# Patient Record
Sex: Female | Born: 1960 | Race: White | Hispanic: No | Marital: Married | Smoking: Never smoker
Health system: Southern US, Community
[De-identification: ages and names within clinical notes are randomized; demographics above are authoritative.]

## PROBLEM LIST (undated history)

## (undated) DIAGNOSIS — C569 Malignant neoplasm of unspecified ovary: Secondary | ICD-10-CM

## (undated) DIAGNOSIS — C179 Malignant neoplasm of small intestine, unspecified: Secondary | ICD-10-CM

## (undated) HISTORY — PX: ABDOMINAL HYSTERECTOMY: SHX81

## (undated) HISTORY — PX: EXPLORATORY LAPAROTOMY W/ BOWEL RESECTION: SHX1544

---

## 1999-07-13 ENCOUNTER — Ambulatory Visit: Admission: RE | Admit: 1999-07-13 | Discharge: 1999-07-13 | Payer: Self-pay | Admitting: Gynecologic Oncology

## 1999-11-08 ENCOUNTER — Ambulatory Visit (HOSPITAL_COMMUNITY): Admission: RE | Admit: 1999-11-08 | Discharge: 1999-11-08 | Payer: Self-pay | Admitting: Gynecologic Oncology

## 1999-12-16 ENCOUNTER — Ambulatory Visit (HOSPITAL_COMMUNITY): Admission: RE | Admit: 1999-12-16 | Discharge: 1999-12-16 | Payer: Self-pay | Admitting: Gynecologic Oncology

## 1999-12-16 ENCOUNTER — Encounter: Payer: Self-pay | Admitting: Gynecologic Oncology

## 1999-12-27 ENCOUNTER — Encounter: Payer: Self-pay | Admitting: Gynecologic Oncology

## 1999-12-27 ENCOUNTER — Ambulatory Visit (HOSPITAL_COMMUNITY): Admission: RE | Admit: 1999-12-27 | Discharge: 1999-12-27 | Payer: Self-pay | Admitting: Gynecologic Oncology

## 1999-12-27 ENCOUNTER — Encounter (INDEPENDENT_AMBULATORY_CARE_PROVIDER_SITE_OTHER): Payer: Self-pay

## 2000-01-10 ENCOUNTER — Ambulatory Visit: Admission: RE | Admit: 2000-01-10 | Discharge: 2000-01-10 | Payer: Self-pay | Admitting: Gynecology

## 2000-04-11 ENCOUNTER — Ambulatory Visit: Admission: RE | Admit: 2000-04-11 | Discharge: 2000-04-11 | Payer: Self-pay | Admitting: Gynecologic Oncology

## 2000-11-06 ENCOUNTER — Ambulatory Visit: Admission: RE | Admit: 2000-11-06 | Discharge: 2000-11-06 | Payer: Self-pay | Admitting: Gynecologic Oncology

## 2000-11-06 ENCOUNTER — Encounter: Payer: Self-pay | Admitting: Gynecologic Oncology

## 2000-11-06 ENCOUNTER — Ambulatory Visit (HOSPITAL_COMMUNITY): Admission: RE | Admit: 2000-11-06 | Discharge: 2000-11-06 | Payer: Self-pay | Admitting: Gynecologic Oncology

## 2001-07-30 ENCOUNTER — Ambulatory Visit: Admission: RE | Admit: 2001-07-30 | Discharge: 2001-07-30 | Payer: Self-pay | Admitting: Gynecologic Oncology

## 2002-01-04 ENCOUNTER — Encounter: Payer: Self-pay | Admitting: Gynecologic Oncology

## 2002-01-04 ENCOUNTER — Ambulatory Visit (HOSPITAL_COMMUNITY): Admission: RE | Admit: 2002-01-04 | Discharge: 2002-01-04 | Payer: Self-pay | Admitting: Gynecologic Oncology

## 2002-03-20 ENCOUNTER — Encounter: Payer: Self-pay | Admitting: Gynecologic Oncology

## 2002-03-20 ENCOUNTER — Ambulatory Visit (HOSPITAL_COMMUNITY): Admission: RE | Admit: 2002-03-20 | Discharge: 2002-03-20 | Payer: Self-pay | Admitting: Gynecologic Oncology

## 2002-03-20 ENCOUNTER — Encounter (INDEPENDENT_AMBULATORY_CARE_PROVIDER_SITE_OTHER): Payer: Self-pay

## 2002-05-06 ENCOUNTER — Ambulatory Visit: Admission: RE | Admit: 2002-05-06 | Discharge: 2002-05-06 | Payer: Self-pay | Admitting: Gynecologic Oncology

## 2002-11-19 ENCOUNTER — Ambulatory Visit: Admission: RE | Admit: 2002-11-19 | Discharge: 2002-11-19 | Payer: Self-pay | Admitting: Gynecologic Oncology

## 2003-03-06 ENCOUNTER — Ambulatory Visit (HOSPITAL_COMMUNITY): Admission: RE | Admit: 2003-03-06 | Discharge: 2003-03-06 | Payer: Self-pay | Admitting: Gynecologic Oncology

## 2003-03-06 ENCOUNTER — Encounter: Payer: Self-pay | Admitting: Gynecologic Oncology

## 2003-05-05 ENCOUNTER — Ambulatory Visit: Admission: RE | Admit: 2003-05-05 | Discharge: 2003-05-05 | Payer: Self-pay | Admitting: Gynecologic Oncology

## 2003-05-11 ENCOUNTER — Ambulatory Visit (HOSPITAL_COMMUNITY): Admission: RE | Admit: 2003-05-11 | Discharge: 2003-05-11 | Payer: Self-pay | Admitting: Gynecologic Oncology

## 2003-05-11 ENCOUNTER — Encounter: Payer: Self-pay | Admitting: Gynecologic Oncology

## 2003-05-14 ENCOUNTER — Ambulatory Visit (HOSPITAL_COMMUNITY): Admission: RE | Admit: 2003-05-14 | Discharge: 2003-05-14 | Payer: Self-pay | Admitting: Gynecologic Oncology

## 2003-05-14 ENCOUNTER — Encounter: Payer: Self-pay | Admitting: Gynecologic Oncology

## 2003-05-14 ENCOUNTER — Encounter (INDEPENDENT_AMBULATORY_CARE_PROVIDER_SITE_OTHER): Payer: Self-pay

## 2003-05-18 ENCOUNTER — Encounter: Payer: Self-pay | Admitting: Gynecologic Oncology

## 2003-05-18 ENCOUNTER — Ambulatory Visit (HOSPITAL_COMMUNITY): Admission: RE | Admit: 2003-05-18 | Discharge: 2003-05-18 | Payer: Self-pay | Admitting: Gynecologic Oncology

## 2003-12-30 ENCOUNTER — Ambulatory Visit: Admission: RE | Admit: 2003-12-30 | Discharge: 2003-12-30 | Payer: Self-pay | Admitting: Gynecologic Oncology

## 2004-02-04 ENCOUNTER — Ambulatory Visit (HOSPITAL_COMMUNITY): Admission: RE | Admit: 2004-02-04 | Discharge: 2004-02-04 | Payer: Self-pay | Admitting: Gynecologic Oncology

## 2004-06-07 ENCOUNTER — Ambulatory Visit: Admission: RE | Admit: 2004-06-07 | Discharge: 2004-06-07 | Payer: Self-pay | Admitting: Gynecologic Oncology

## 2005-03-20 ENCOUNTER — Ambulatory Visit (HOSPITAL_COMMUNITY): Admission: RE | Admit: 2005-03-20 | Discharge: 2005-03-20 | Payer: Self-pay | Admitting: Gynecologic Oncology

## 2005-03-20 ENCOUNTER — Encounter (INDEPENDENT_AMBULATORY_CARE_PROVIDER_SITE_OTHER): Payer: Self-pay | Admitting: *Deleted

## 2006-03-21 ENCOUNTER — Ambulatory Visit: Admission: RE | Admit: 2006-03-21 | Discharge: 2006-03-21 | Payer: Self-pay | Admitting: Obstetrics and Gynecology

## 2006-07-24 ENCOUNTER — Ambulatory Visit: Admission: RE | Admit: 2006-07-24 | Discharge: 2006-07-24 | Payer: Self-pay | Admitting: Gynecologic Oncology

## 2006-08-13 ENCOUNTER — Encounter (INDEPENDENT_AMBULATORY_CARE_PROVIDER_SITE_OTHER): Payer: Self-pay | Admitting: *Deleted

## 2006-08-13 ENCOUNTER — Ambulatory Visit (HOSPITAL_COMMUNITY): Admission: RE | Admit: 2006-08-13 | Discharge: 2006-08-13 | Payer: Self-pay | Admitting: Obstetrics and Gynecology

## 2007-01-30 ENCOUNTER — Ambulatory Visit: Admission: RE | Admit: 2007-01-30 | Discharge: 2007-01-30 | Payer: Self-pay | Admitting: Gynecologic Oncology

## 2007-07-23 ENCOUNTER — Ambulatory Visit: Admission: RE | Admit: 2007-07-23 | Discharge: 2007-07-23 | Payer: Self-pay | Admitting: Gynecologic Oncology

## 2007-12-20 ENCOUNTER — Ambulatory Visit: Admission: RE | Admit: 2007-12-20 | Discharge: 2007-12-20 | Payer: Self-pay | Admitting: Gynecologic Oncology

## 2007-12-20 ENCOUNTER — Encounter (INDEPENDENT_AMBULATORY_CARE_PROVIDER_SITE_OTHER): Payer: Self-pay | Admitting: Interventional Radiology

## 2008-03-10 ENCOUNTER — Ambulatory Visit: Admission: RE | Admit: 2008-03-10 | Discharge: 2008-03-10 | Payer: Self-pay | Admitting: Gynecologic Oncology

## 2008-09-02 ENCOUNTER — Ambulatory Visit: Admission: RE | Admit: 2008-09-02 | Discharge: 2008-09-02 | Payer: Self-pay | Admitting: Gynecologic Oncology

## 2009-03-10 ENCOUNTER — Ambulatory Visit (HOSPITAL_COMMUNITY): Admission: RE | Admit: 2009-03-10 | Discharge: 2009-03-10 | Payer: Self-pay | Admitting: Gynecologic Oncology

## 2009-03-17 ENCOUNTER — Ambulatory Visit (HOSPITAL_COMMUNITY): Admission: RE | Admit: 2009-03-17 | Discharge: 2009-03-17 | Payer: Self-pay | Admitting: Gynecologic Oncology

## 2009-03-19 ENCOUNTER — Ambulatory Visit (HOSPITAL_COMMUNITY): Admission: RE | Admit: 2009-03-19 | Discharge: 2009-03-19 | Payer: Self-pay | Admitting: Gynecologic Oncology

## 2009-03-19 ENCOUNTER — Encounter (INDEPENDENT_AMBULATORY_CARE_PROVIDER_SITE_OTHER): Payer: Self-pay | Admitting: Interventional Radiology

## 2009-06-09 ENCOUNTER — Ambulatory Visit: Admission: RE | Admit: 2009-06-09 | Discharge: 2009-06-09 | Payer: Self-pay | Admitting: Gynecologic Oncology

## 2009-09-02 ENCOUNTER — Ambulatory Visit (HOSPITAL_COMMUNITY): Admission: RE | Admit: 2009-09-02 | Discharge: 2009-09-02 | Payer: Self-pay | Admitting: Gynecologic Oncology

## 2009-09-15 ENCOUNTER — Ambulatory Visit: Admission: RE | Admit: 2009-09-15 | Discharge: 2009-09-15 | Payer: Self-pay | Admitting: Gynecologic Oncology

## 2010-03-07 ENCOUNTER — Ambulatory Visit (HOSPITAL_COMMUNITY): Admission: RE | Admit: 2010-03-07 | Discharge: 2010-03-07 | Payer: Self-pay | Admitting: Gynecologic Oncology

## 2010-11-23 IMAGING — CT CT ABDOMEN W/ CM
2 of 6 series · 15 of 46 positions shown, 17 images · IV contrast (agent unspecified)
Comparison: Prior abdominal and pelvic CT studies dated 03/20/2005
and prior chest CT dated 03/06/2003

CT CHEST

CLINICAL DATA: History of metastatic ovarian carcinoma.  Palpable
enlarging right inguinal lymph node.  History of recurrent splenic
cyst requiring percutaneous drainage and ablation.

CT CHEST, ABDOMEN AND PELVIS WITH CONTRAST
TECHNIQUE: Multidetector CT imaging of the chest, abdomen and
pelvis was performed following the standard protocol during bolus
administration of intravenous contrast.
Contrast: 100 ml [id number] IV

[Series 2: cap with st · axial · 0.68mm/px · z∈[-597,-42]mm · 12 of 127 slices shown, 14 images]
[im 8/127  soft-tissue]
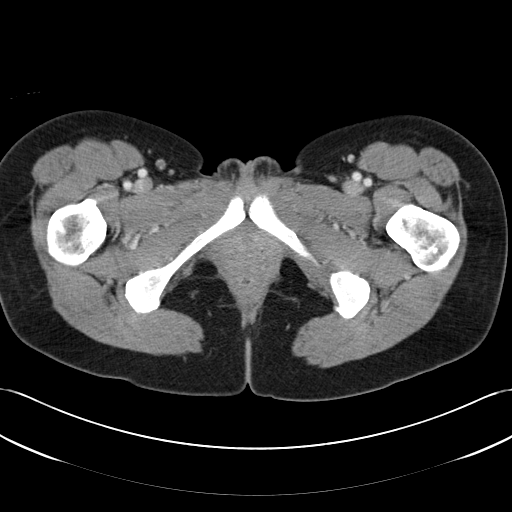
[im 8/127  bone]
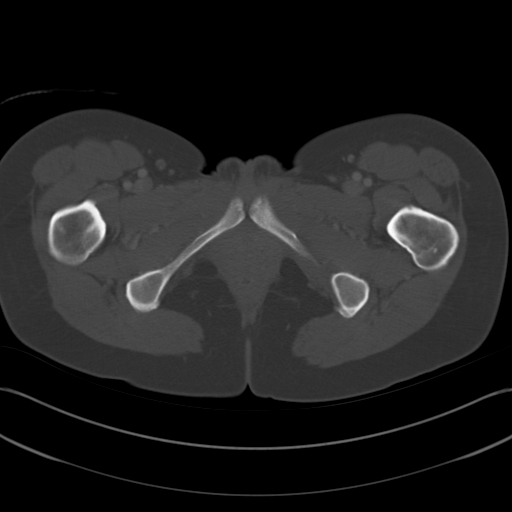
[im 16/127  soft-tissue]
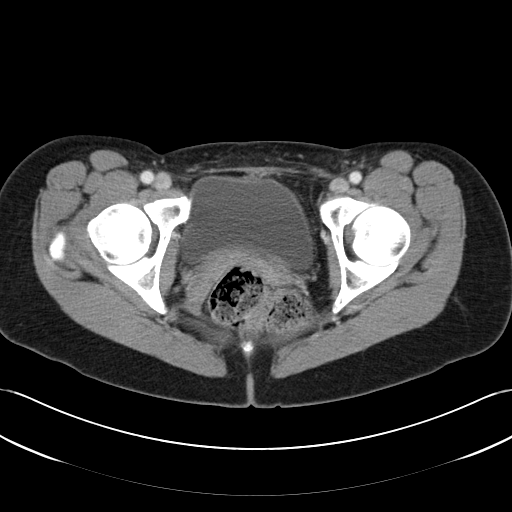
[im 32/127  soft-tissue]
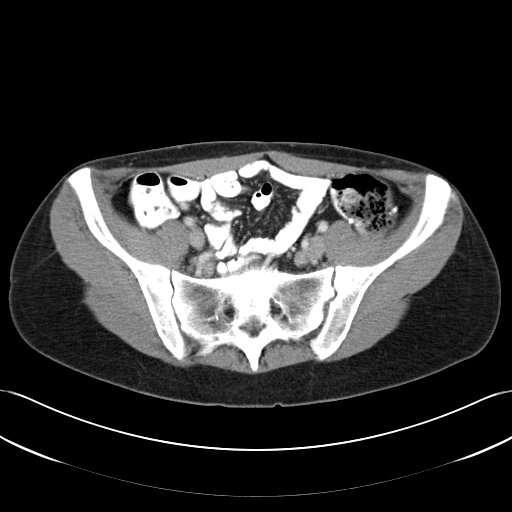
[im 40/127  soft-tissue]
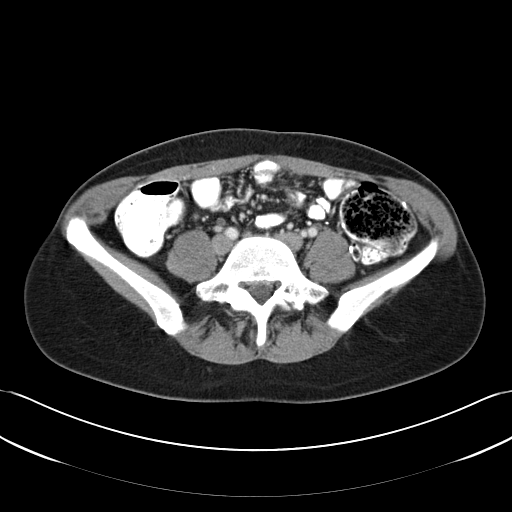
[im 48/127  soft-tissue]
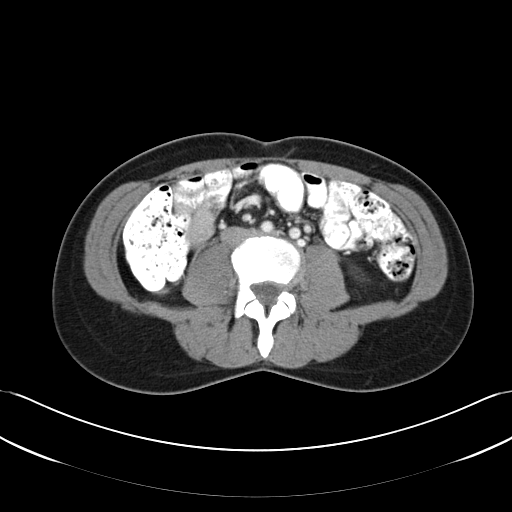
[im 56/127  soft-tissue]
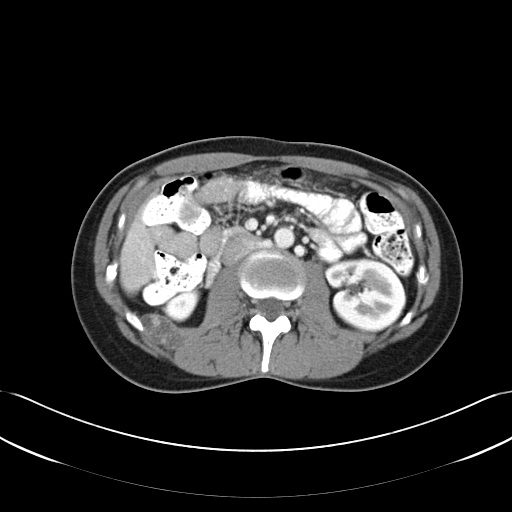
[im 71/127  soft-tissue]
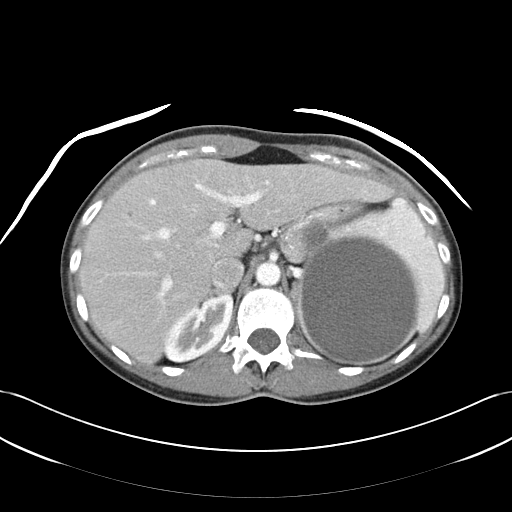
[im 79/127  soft-tissue]
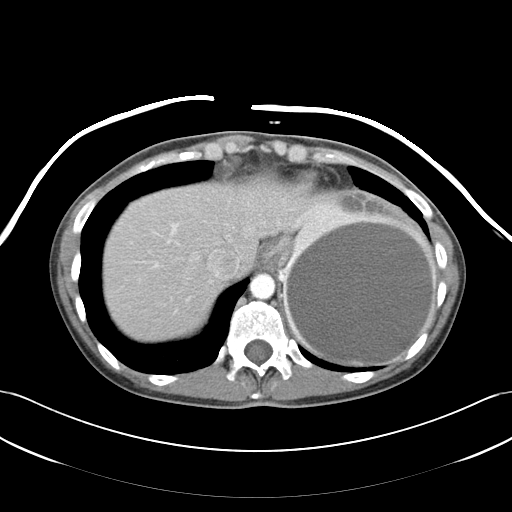
[im 87/127  soft-tissue]
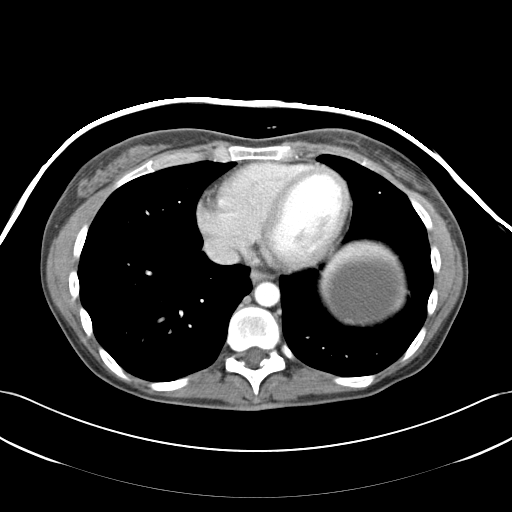
[im 87/127  bone]
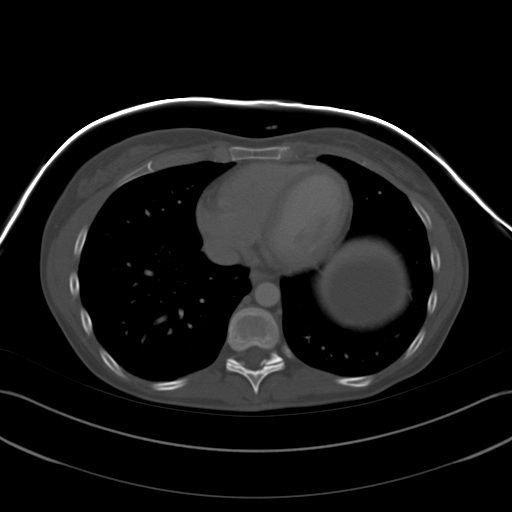
[im 95/127  soft-tissue]
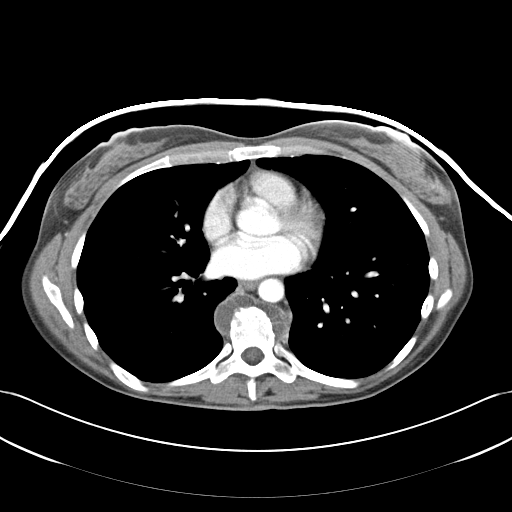
[im 111/127  soft-tissue]
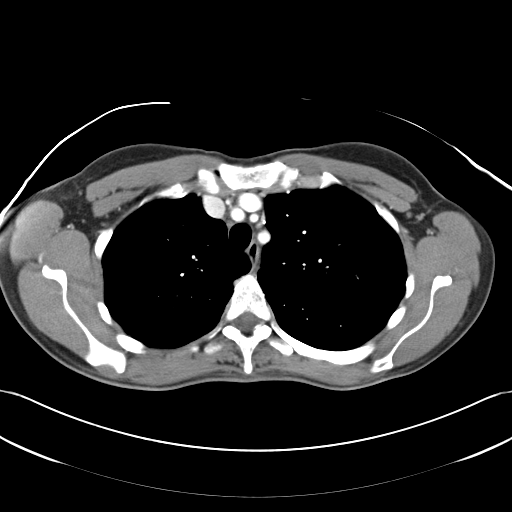
[im 119/127  soft-tissue]
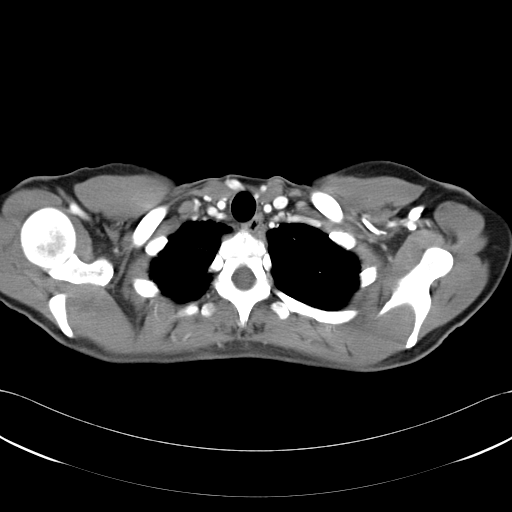

[Series 602: coronal images · coronal · 1.27mm/px · 3 of 74 slices shown]
[im 25/74  soft-tissue]
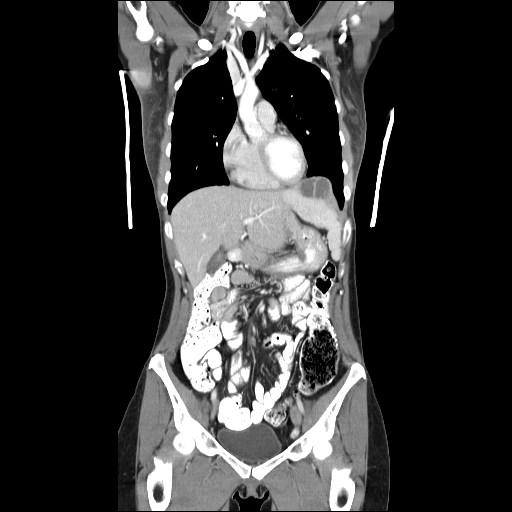
[im 33/74  soft-tissue]
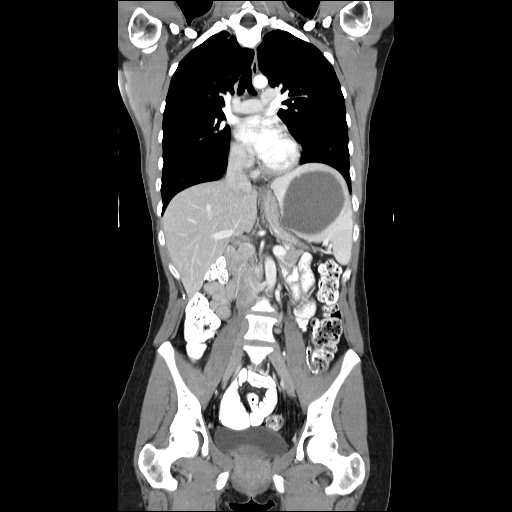
[im 41/74  soft-tissue]
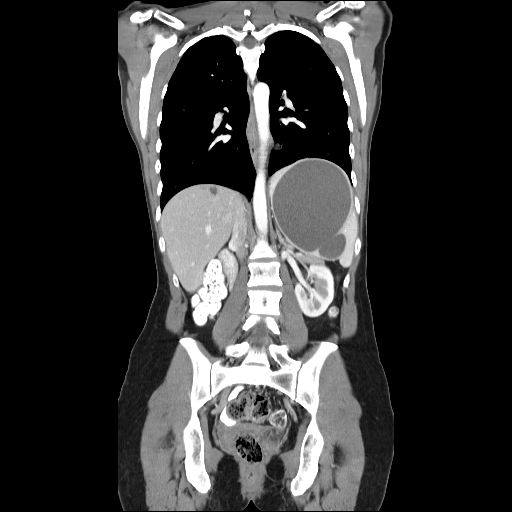

[15 of 46 positions shown; findings below may reference images not displayed]

FINDINGS: There is new paraspinous tumor in the chest not
visualized by CT in 8336.  This consists of necrotic appearing
paraspinous tumor on both sides of the thoracic spine spanning from
approximately the T6 level to the T9 level on the right and the T6
level to T10 level on the left.  Circumferential tumor surrounds
much of the circumference of the vertebral body anteriorly and
laterally at these levels with measured maximal thickness of
approximately 1.4 cm on the right and also 1.4 cm on the left.

New metastatic lesion is also present in the lower anterior
mediastinum within mediastinal fat and involving the lower margin
of the pericardium.  At this level this tumor measures
approximately 1.6 x 2.2 centimeters on axial images.

No axillary, mediastinal or hilar adenopathy is identified.  Lung
windows show no focal pulmonary masses.  No evidence of pleural or
pericardial fluid.  No bony lesions identified.
IMPRESSION: New metastatic tumor with extensive paraspinous tumor noted in the
chest spanning from roughly T6 to T10 with circumferential necrotic
appearing tumor present.  There also is a lower mediastinal /
pericardial mass measuring 1.6 x 2.2 cm.  This is not causing a
pericardial effusion.

CT ABDOMEN
FINDINGS: The cystic change at the dome of the right lobe of the
liver shows increased size and prominence since 3338 making it
suspicious for necrotic tumor.  Maximal axial dimensions are
approximately 2.1 x 2.1 cm.  There also is a new metastatic lesion
in the posterior lower right flank region involving the abdominal
wall and abutting the posterior lower pole of the right kidney.
This tumor is lobulated and necrotic in appearance measuring
approximately 1.9 x 3.3 x 3.6 cm.  This tumor also abuts a portion
of the distal right 11th rib.

Large splenic cyst present measuring approximately 12 cm in
greatest diameter.  There is a lobulated component to this cyst
inferiorly and anteriorly.  Internal density is consistent with
complex fluid with average density measurement of 25 HU.  There
remains associated stable cystic area in the anterior and superior
spleen that is separate from the dominant splenic cyst.  This
additional area measures approximately 1.6 x 4.8 cm.

No additional lesions are identified in the abdomen.  There is no
evidence of peritoneal or omental disease.  No ascites is present.
No enlarged lymph nodes.  Bowel loops are of normal caliber.
IMPRESSION: New necrotic metastatic disease involving the posterior right flank
region that involves the abdominal wall musculature and abuts the
posterior lower pole of the kidney.  Enlarged cystic area in the
dome of the liver also may represent enlarging necrotic tumor.
There is a recurrent splenic cyst.

CT PELVIS
FINDINGS: Enlarged and necrotic lymphadenopathy is present in the
pelvis.  Multi-loculated and necrotic-appearing lymph node mass in
the right inguinal region extends into the distal external iliac
region and measures approximately 2.2 x 3.8 cm.

Distal external iliac chain mass on the left measures approximately
1.6 x 2.3 cm.  Additional higher external iliac chain lymph node
measures approximately 1.2 x 2.1 cm.  Some other smaller lymph
nodes are also present in bilateral inguinal regions.

No free fluid.  The bladder is unremarkable.  Bowel loops are
normal caliber.  No hernias.  No bony lesions.
IMPRESSION: New necrotic metastatic nodal disease in both external iliac
regions and the right inguinal region.

## 2011-01-02 LAB — APTT: aPTT: 26 seconds (ref 24–37)

## 2011-01-02 LAB — CBC
MCHC: 33.7 g/dL (ref 30.0–36.0)
Platelets: 184 10*3/uL (ref 150–400)
RBC: 5.2 MIL/uL — ABNORMAL HIGH (ref 3.87–5.11)
RDW: 12.5 % (ref 11.5–15.5)

## 2011-01-02 LAB — PROTIME-INR
INR: 0.95 (ref 0.00–1.49)
Prothrombin Time: 12.6 seconds (ref 11.6–15.2)

## 2011-01-23 LAB — PROTIME-INR: INR: 1 (ref 0.00–1.49)

## 2011-01-23 LAB — CBC: WBC: 7.7 10*3/uL (ref 4.0–10.5)

## 2011-01-23 LAB — CA 125: CA 125: 50.1 U/mL — ABNORMAL HIGH (ref 0.0–30.2)

## 2011-02-28 NOTE — Consult Note (Signed)
[name], [name]               ACCOUNT NO.:  [id number]   MEDICAL RECORD NO.:  [id number]          PATIENT TYPE:  OUT   LOCATION:  XRAY                         FACILITY:  [facility]   PHYSICIAN:  John T. Kyla Balzarine, M.D.    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   HISTORY OF PRESENT ILLNESS:  This patient had stage IIIC ovarian  malignancy optimally debulked in 1992 with borderline histology.  She  had TAH/BSO and omentectomy and received postoperative cisplatin and  Cytoxan.  She developed an invasive intraperitoneal recurrence that was  treated with radical surgery in 1996 including rectosigmoid resection  and anastomosis.  She was treated with Taxol and cisplatin, but after an  allergic reaction, Taxol was changed to Taxotere.  She developed  progression in the liver but then had a complete response to topotecan.  Since 2000, she has had persistent/recurrent perisplenic cysts managed  with p.r.n. drainage every 12-18 months.  On several occasions, she has  had positive cytology from the cysts.  In the past, she was treated with  intracystic cisplatin, percutaneous tube placement with sclerosis using  both doxycycline and alcohol injection, and all were unsuccessful.  Last  cyst drainage was in March 2009 with negative cytology but psammoma  bodies identified.  Patient remains quite physically active and has no  significant symptoms other than an occasional grabbing subcostal  discomfort when she exercises hard and a sensation of increasing  fullness in her right groin with a newly palpable lymph node.  She  underwent a follow-up CT scan on Mar 10, 2009, which revealed  paraspinous tumors spanning T6 to T12 with lesions measuring  approximately 1.4 cm maximal thickness, lower anterior mediastinum  measuring 1.6 x 2.2 cm, increased size and prominence of cystic change  at the dome of the right lobe of the liver measuring 2.1 x 2.1 cm, new  lesion in the posterior right flank region abutting the posterior lower  pole of the right kidney, lobulated and necrotic measuring 1.9 x 3.3 x  3.6 cm, splenic cyst measuring 12 cm in greatest diameter with lobulated  component inferiorly and anteriorly.  In the pelvis, a 1.6 x 2.3 cm  distal left external iliac node and higher external iliac chained node  measuring 1.2 x 2.1 cm.  In the right, there is a multiloculated node in  the distal external iliac/groin region measuring 2.2 x 3.8 cm.  PET CT  scan was performed earlier today and was reviewed with radiology,  comparing films to the images from CT scan.  There is at best modest  uptake which correlates with the region seen on CT scan.  The area of  most intense uptake for the most part is in the right groin.   It should be noted the last CA-125 value 6 months ago was in the mid-  60s.   PAST MEDICAL HISTORY:  Dominated by ovarian malignancy.   MEDICATIONS:  1. Premarin 0.9 mg.  2. Multivitamins.   ALLERGIES:  PAXIL and TAXOL.   FAMILY HISTORY:  Mother with pancreatic cancer.   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  Rare social ethanol.   REVIEW OF SYSTEMS:  A 10-point system review negative.   PHYSICAL EXAMINATION:  VITAL SIGNS:  Weight 117 pounds/stable vital  signs.  GENERAL:  Patient is anxious, alert and oriented x3, in no acute  distress.  LYMPH SURVEY:  Significant for a new 3 cm right groin node.  LUNGS:  Clear.  BACK:  No spinous or CVA tenderness.  ABDOMEN:  Soft and benign with no palpable mass, hernia or organomegaly.  No ascites.  EXTREMITIES:  Full strength and range of motion without edema, cords or  Homan's.  PELVIC:  External genitalia and BUS, bladder and urethra and vagina are  all clear.  On bimanual and rectovaginal  examinations, absent uterus and  cervix with no mass or nodularity and no tenderness.   ASSESSMENT:  Progression of LMP tumor; PET activity implies low-grade  lesions.   PLAN:  Patient was discussed with Dr. Fredia Sorrow, and we have scheduled  fine needle aspiration of her large splenic cyst in the right groin node  on March 19, 2009.  Cyst fluid will be sent for cytology, and if positive,  we will obtain staining for estrogen and progesterone receptor.  For  now, we will change the patient from straight Premarin to Prempro.  She  might benefit from hormonal manipulation if cytology confirms low-grade  lesion.      John T. Kyla Balzarine, M.D.  Electronically Signed     JTS/MEDQ  D:  03/17/2009  T:  03/17/2009  Job:  [id number]   cc:   Telford Nab, R.N.  [address]  [place], [place] [address]   Leona Singleton, M.D.  Fax: [phone]

## 2011-02-28 NOTE — Consult Note (Signed)
[name], [name]               ACCOUNT NO.:  [id number]   MEDICAL RECORD NO.:  [id number]          PATIENT TYPE:  OUT   LOCATION:  GYN                          FACILITY:  [facility]   PHYSICIAN:  John T. Kyla Balzarine, M.D.    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   HISTORY OF PRESENT ILLNESS:  This patient had stage III C ovarian  malignancy, optimally debulked in 1992 with borderline histology.  She  had TAH/BSO and omentectomy, receiving cisplatin and Cytoxan.  She  developed an invasive intraperitoneal recurrence treated with radical  surgery in 1996 including rectosigmoid resection/and anastomosis.  She  received Taxol and cisplatin but after an allergic reaction, Taxol was  changed to Taxotere.  She progressed in the liver but had a complete  response to topotecan.  Since 2000, she has had persistent/recurrent  perisplenic cyst managed with p.r.n. drainage every 12 to 18 months.  On  several occasions she has had positive cytology from the cyst.  In the  past, she has been treated with intracystic cisplatin, percutaneous tube  placement with sclerosis using doxycycline and alcohol injection, all  relatively unsuccessful.  Last cyst drainage was in March 2009 and had  only psammoma bodies.  The patient remains quite physically active and  is pursuing an advanced nursing degree.  She has occasional grabbing  subcostal discomfort when she exercises hard and is concerned because  she believes that her exercise tolerance is decreased and she winds  easily.  She also knows pulling discomfort in the right groin.  Bowel  and bladder functions are normal and she denies early satiety or  increased GERD-type symptoms.   PAST MEDICAL HISTORY:  Dominated by ovarian malignancy.   MEDICATIONS:  Premarin and multivitamins.   ALLERGIES:  PAXIL and TAXOL.   FAMILY HISTORY:  Mother with pancreatic cancer.   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  Rare social ethanol.   REVIEW OF SYSTEMS:  A 10-point review negative.   EXAM:  VITAL SIGNS:  The weight is 118 pounds and vital signs stable.  CONSTITUTIONAL:  The patient is anxious, alert and oriented x3 in no  acute distress.  LYMPHATICS:  A 1 cm right groin node, nontender.  BACK:  No spinous or CVA tenderness.  ABDOMEN:  Soft and benign with no palpable mass, hernia, ascites or  tenderness.  EXTREMITIES:  Full strength and range of motion with no cords, Homans'  or edema.  PELVIC:  External genitalia and BUS, bladder and urethra normal to  inspection and palpation.  The vagina has no mucosal lesions.  On  bimanual and rectovaginal examinations, there is some scarring at the  apex of the vagina but no distinct mass or cul-de-sac nodularity.   LAB:  Dr. Laureen Ochs obtained recent CA-125 value that was approximately  66.   ASSESSMENT:  Low malignant potential tumor of the ovary  with recurrent  perisplenic cyst.  Slight rise in CA-125 value.   PLAN:  The patient is quite anxious about her nonspecific symptoms and  we will set up a CT of chest, abdomen and pelvis in the near future.  If  she has only a small perisplenic cyst, we will defer drainage until she  becomes symptomatic.  Routine followup would be in 6 months the and we  would obtain a CA-125 at the time of her next followup.      John T. Kyla Balzarine, M.D.  Electronically Signed     JTS/MEDQ  D:  09/02/2008  T:  09/02/2008  Job:  [id number]   cc:   Leona Singleton, M.D.  Fax: [phone]   Telford Nab, R.N.  [address]  [place], [place] [address]

## 2011-02-28 NOTE — Consult Note (Signed)
[name], [name]               ACCOUNT NO.:  [id number]   MEDICAL RECORD NO.:  [id number]          PATIENT TYPE:  OUT   LOCATION:  GYN                          FACILITY:  [facility]   PHYSICIAN:  John T. Kyla Balzarine, M.D.    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   HISTORY OF PRESENT ILLNESS:  This patient had stage IIIC ovarian  malignancy optimally debulked in 1992.  This had borderline histology.  After TAH/BSO and omentectomy, she received cisplatin and Cytoxan  chemotherapy.  She had an invasive intraperitoneal recurrence treated  with radical surgery in 1996.  She received Taxol and cisplatin  chemotherapy, but after an allergic reaction, the Taxol was changed to  Taxotere.  She had disease progression in the liver, but a complete  response to topotecan.  Since 2000, she has had persistent/recurrent  perisplenic cyst managed with p.r.n. drainage.  On several occasions,  she had positive cytology from the cyst.  She has been treated with  intracystic cisplatin, had percutaneous tube placement with sclerosis  using doxycycline and alcohol injection after cyst aspiration, all  relatively unsuccessful.  She notes slight increase in left upper  quadrant pressure, early satiety and occasional grabbing pain.  She  denies nausea or vomiting or persistent, continuous pain.  She denies  pelvic symptoms.  Her last aspiration was performed approximately 1 year  ago.  She brings with her CT films for review.  Past history, personal  and social history, family history are reviewed and essentially  unchanged from those recorded in June 2007.   MEDICATIONS:  Premarin and multivitamins.   ALLERGIES:  PAXIL and TAXOL.   REVIEW OF SYSTEMS:  Other than noted above, negative in the  comprehensive 10-point review.   EXAM:  VITAL SIGNS:  Stable and  afebrile.  LYMPH SURVEY:  No pathologic lymphadenopathy.  BACK:  No back or CVA tenderness.  EXTREMITIES:  No edema, cords or Homan's.  PELVIC:  External genitalia and BUS are normal to inspection and  palpation.  The bladder and urethra are normal.  Bimanual and  rectovaginal examinations reveal absent uterus and cervix, no mass or  nodularity.   The report from her outside CT scan is reviewed and there is a question  as to whether she actually has any change in multiple low-density  changes adjacent to the capsule of an approximately 10-cm  perisplenic/intrasplenic cyst.  The CD-ROM was given to our vascular  radiologist for comparison to prior scans here and their opinion was  that there was no significant change.   ASSESSMENT:  Recurrent perisplenic cyst; likely persistent low-malignant-  potential tumor of the ovary with the stable findings on CT scan.   PLAN:  CA125 value will be obtained today.  Aspiration of these  perisplenic cyst would be set up on a p.r.n. basis, really dependent  upon the patient's symptoms.  I would like to see her at 57-month  intervals or  sooner at any time on a p.r.n. basis.      John T. Kyla Balzarine, M.D.  Electronically Signed     JTS/MEDQ  D:  07/23/2007  T:  07/24/2007  Job:  [id number]   cc:   Jayme Cloud MD   Telford Nab, R.N.  [address]  [place], [place] [address]

## 2011-02-28 NOTE — Consult Note (Signed)
[name], [name]               ACCOUNT NO.:  [id number]   MEDICAL RECORD NO.:  [id number]          PATIENT TYPE:  OUT   LOCATION:  GYN                          [facility]ACILITY:  [facility]   PHYSICIAN:  John T. Kyla Balzarine, M.D.    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   HISTORY OF PRESENT ILLNESS:  This patient had stage IIIc ovarian  malignancy, optimally debulked in 1992 with borderline histology.  She  had TAH/BSO and omentectomy, receiving cisplatin and Cytoxan.  She had  an invasive intraperitoneal recurrence, treated with radical surgery in  1996.  She received Taxol and cisplatin chemotherapy, but, after an  allergic reaction, Taxol was changed to Taxotere.  She progressed in the  liver but had a complete response to Topotecan.  Since 2000, she has had  a persistent/recurrent perisplenic cyst managed with p.r.n. drainage and  on several occasions has had positive cytology from the cyst.  In the  past she has been treated with intracystic cisplatin, percutaneous tube  placement with sclerosis using doxycycline and alcohol injection after  cyst aspiration, all relatively unsuccessful.  She has intermittent left  upper quadrant pressure but underwent a cyst drainage on March 6,  and those symptoms have improved.  She is quite active and runs 5K runs.  She is working full time.  She denies nausea or vomiting or persistent  continuous pain or pelvic symptoms.  She has an occasional grabbing  subcostal discomfort when she exercises hard.   PAST MEDICAL HISTORY:  Dominated by her ovarian malignancy.   MEDICATIONS:  Premarin and multivitamins.   ALLERGIES:  PAXIL AND TAXOL.   FAMILY HISTORY:  Mother with pancreatic cancer.   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  Rare social  ethanol.   PHYSICAL EXAMINATION:  VITAL SIGNS:  Weight 119 pounds and vital signs  stable.  GENERAL:  The patient is alert and oriented x 3, in no acute distress.  LYMPH SURVEY:  Negative for pathologic lymphadenopathy.  ABDOMEN:  Scaphoid, soft and benign with no hernia, ascites, tenderness  or mass.  EXTREMITIES:  Full strength and range of motion without edema, cords or  Homan's.  PELVIC:  External genitalia and BUS, bladder and urethra and vagina are  all clear.  Bimanual and rectovaginal examinations disclose absent  uterus and cervix with no mass or nodularity and no tenderness.   LABORATORY:  Cytology from the cyst aspiration was significant for  cystic debris and  __________bodies with a few epithelial cells, not  diagnostic.   ASSESSMENT:  Borderline tumor of the ovary involving the peritoneum with  a perisplenic cyst.   PLAN:  The p.r.n. aspirations as we have been doing.  I will plan to see  her back for followup in 6 months and would obtain a CA-125 value at her  next visit.      John T. Kyla Balzarine,  M.D.  Electronically Signed     JTS/MEDQ  D:  03/10/2008  T:  03/10/2008  Job:  [id number]   cc:   Telford Nab, R.N.  [address]  [place], [place] [address]   Leona Singleton, M.D.  Fax: [phone]

## 2011-02-28 NOTE — Consult Note (Signed)
NAMEKAMPBELL, HOLAWAY               ACCOUNT NO.:  [id number]   MEDICAL RECORD NO.:  [id number]          PATIENT TYPE:  OUT   LOCATION:  GYN                          FACILITY:  [facility]   PHYSICIAN:  John T. Kyla Balzarine, M.D.    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   HISTORY OF PRESENT ILLNESS:  This patient had stage III C ovarian  malignancy optimally debulked in 1992 with borderline histology.  She  had a TAH/BSO and omentectomy treated with cisplatin and Cytoxan.  She  developed an intraperitoneal invasive recurrence treated with radical  surgery in 1996 including rectosigmoid resection with reanastomosis.  She progressed after Taxol/cisplatin was changed to Taxotere because of  an allergic reaction.  She had a complete response to topotecan.  Since  2000, she has had recurrent perisplenic cysts managed with p.r.n.  drainage every 12-18 months and has had intermittent positive cytology.  In the past, she was treated with intracystic cisplatin, percutaneous  tube placement with sclerosis using both doxycycline and alcohol; all  unsuccessful.  Follow-up CT scan in May 2010 revealed likely cystic  nodal progression with nodes extending from the groins to the chest.  She had palpable right groin node that measured approximately 3 cm in  largest diameter.  We elected trial of tamoxifen therapy, and she  discontinued her Premarin.  Since she started tamoxifen, she has had hot  flashes but otherwise has tolerated this well.  She has noted no  enlargement in her groin node, pain or leg swelling.   PAST MEDICAL HISTORY:  Dominated by ovarian malignancy.   MEDICATIONS:  Currently tamoxifen and multivitamins.   ALLERGIES:  PAXIL AND TAXOL.   FAMILY HISTORY:  Mother with pancreatic cancer.   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  Rare social ethanol.   REVIEW OF SYSTEMS:  A 10-point review otherwise negative.   PHYSICAL EXAMINATION:  VITAL SIGNS:  Weight stable at 117 pounds.  Lymph  survey is positive only for a 3-cm slightly cystic right groin node,  nontender.  ABDOMEN:  Scaphoid, soft and benign with well-healed midline  incision.  No hernia, ascites, tenderness or mass.  EXTREMITIES:  No cords, edema or Homan's.   ASSESSMENT:  LMP tumor of the ovary with retroperitoneal progression, on  tamoxifen.   PLAN:  CA-125 value will be obtained to date mainly for reference.  She  will undergo follow-up CT scan and CA-125 value in approximately 3  months will see me after these studies.  Unless there is frank  progression, we would continue treatment with tamoxifen indefinitely.      John T. Kyla Balzarine, M.D.  Electronically Signed     JTS/MEDQ  D:  06/09/2009  T:  06/09/2009  Job:  [id number]   cc:   Telford Nab, R.N.  [address]  [place], [place] [address]  Leona Singleton, M.D.  Fax: [phone]

## 2011-03-03 NOTE — Consult Note (Signed)
[facility]  Patient:    [name], [name] Visit Number: [id number] MRN: [id number]          Service Type: GON Location: GYN Attending Physician:  Sabino Donovan Dictated by:   Jackquline Denmark. Kyla Balzarine, M.D. Proc. Date: 05/06/02 Admit Date:  05/06/2002 Discharge Date: 05/06/2002   CC:         Telford Nab, R.N.  Harl Bowie, M.D.  [facility] [address] [place], [place] [address]   Consultation Report  Ms. 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  INTERVAL HISTORY:  Since the patient was last seen, she had increasing symptoms and a slowly rising CA-125 value.  She underwent a scan directed tap of her perisplenic cyst yielding 450 cc of bloody fluid performed March 20, 2002.  The final cytology revealed degenerating blood and a rare cluster of benign epithelioid cells.  No cancer cells were seen.  The patient has no obstructive type symptoms, nausea, vomiting, or abdominal bloating.  She has minimal left upper quadrant/back discomfort, but otherwise is at full activity without limitations and functional activity.  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  She received cisplatin/Cytoxan chemotherapy but are lapsed in the pelvis and upper abdomen with sigmoid and liver involvement.  She was treated with rectosigmoid resection/anastomosis in 1996 and subsequent Taxol/platin chemotherapy.  She develops an allergic reaction to Taxol and subsequently received Taxotere. She had apparent disease progression and was changed to topotecan and has been followed with essentially normal CA-125 values, but with a perisplenic cyst which has been managed since March 2000 with sequential drainages of the perisplenic cyst.  We attempted intracystic cisplatin in February 2002, but had  progressive recurrence of the cyst and enlargement to 9.6 x 9.8 cm, increasing symptoms, and performed simple CT directed drainage in June 2003.  PAST MEDICAL HISTORY:  Significant for no major comorbidities and no other surgeries.  CURRENT MEDICATIONS:  Premarin 1.25 mg q.d. and multivitamin.  ALLERGIES:  TAXOL.  PERSONAL SOCIAL HISTORY:  Married.  Denies tobacco and admits rare ethanol.  FAMILY HISTORY:  Significant for pancreatic cancer in her mother.  REVIEW OF SYSTEMS:  Essentially otherwise negative.  PHYSICAL EXAMINATION  VITAL SIGNS:  Weight 118 pounds, vital signs stable and afebrile.  GENERAL:  The patient is alert and oriented x3 and in no acute distress.  HEENT:  Benign.  LYMPH NODE SURVEY:  No supraclavicular or inguinal adenopathy.  LUNG:  Fields clear.  BACK:  No back or CVA tenderness.  ABDOMEN:  Soft and benign with no palpable mass, hernia, or organomegaly.  No fluid wave or gross ascites are present.  There is no abdominal tenderness.  EXTREMITIES:  Full range of motion with no edema.  PELVIC:  External genitalia and BUS are normal to inspection and palpation. Bladder and urethra well supported.  Vaginal mucosa is clear.  Bimanual and rectovaginal examinations reveal absent uterus and cervix without mass or nodularity.  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  PLAN:  The patient appears to be doing well at present.  We will see her back for follow-up to include CA-125 value in approximately six months and would rescan her next summer.  We again discuss options of performing surgical splenectomy and extirpation of the cyst but I see no contraindications currently to performing serial  drainage of the perisplenic cyst. Dictated by:   Jackquline Denmark. Kyla Balzarine, M.D. Attending Physician:  Ronita Hipps T DD:  05/06/02 TD:  05/10/02 Job: [id number]

## 2011-03-03 NOTE — Consult Note (Signed)
[facility]  Patient:    [name], [name]                      MRN: [id number] Proc. Date: 11/06/00 Attending:  Jonny Ruiz T. Kyla Balzarine, M.D. CC:         Harl Bowie, M.D., [facility], [address] [place], [place]             Valinda Party, M.D., [address], [place], [place], [place] [address]  Telford Nab, R.N., [facility], [place]. GYN/ONC   Consultation Report  HISTORY OF PRESENT ILLNESS:  Ms. Bacot returns for ongoing follow-up of recurrent/persisent borderline tumor of the ovary.  Since she was last seen, she has had gradual increase in her left upper quadrant fullness with discomfort, precipitated by pressure, yoga, and when she lies on her left side.  Otherwise, she continues to be quite active without limitations in functional activity.  She has no obstructive type symptoms, pelvic pain, or weight loss.  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  She received cisplatin/Cytoxan chemotherapy, but relapsed in the pelvis and upper abdomen with sigmoid colon and liver involvement.  She was treated with rectosigmoid resection/reanastomosis five years ago, treated with Taxol/cisplatin chemotherapy initially, but developed an allergic reaction to Taxol and subsequently received Taxotere/cisplatin. She was changed to topotecan following apparent disease progression with new hepatic lesions.  After topotecan, she normalized her CA-125 value.  Last chemotherapy was given in 1997.  In February 1999, she developed a perisplenic cyst approximately 5-7 cm in diameter.  Biopsy via fine needle aspirate revealed cells compatible with her borderline lesion.  She has under percutaneous drainage of her perisplenic cyst twice, in March 2000 and March 2001.  Cytopathology has intermittently had benign histology.  PAST MEDICAL HISTORY:  No major co-morbidities and no other  surgeries.  CURRENT MEDICATIONS: 1. Premarin 1.25 mg q.d. 2. Multivitamins.  ALLERGIES:  TAXOL.  PERSONAL AND SOCIAL HISTORY:  The patient is married.  She denies tobacco and admits rare ethanol.  FAMILY HISTORY:  Pancreatic cancer in her mother.  REVIEW OF SYSTEMS:  Essentially negative without general, systemic, ENT, cardiopulmonary, GI/GU, or GYN complaints except as above.  PHYSICAL EXAMINATION:  VITAL SIGNS:  Stable with blood pressure 112/70, weight 117, and temperature afebrile.  GENERAL APPEARANCE:  The patient is alert and oriented x 3 in no acute distress.  HEENT:  Benign and clear.  Oropharynx clear.  NECK:  Supple without goiter.  CHEST:  Lung fields are clear.  MUSCULOSKELETAL:  There is no back or CVA tenderness.  ABDOMEN:  Soft and benign with well-healed midline incision.  There is no tenderness, and I am unable to palpate organomegaly or mass either in the supine or erect positions.  EXTREMITIES:  Full range of motion with normal strength and DTRs and without edema.  GENITOURINARY:  External genitalia and BUS are normal to inspection and palpation.  The bladder and urethra are well supported, and the vaginal is clear.  Bimanual and rectovaginal examinations reveal no mass or nodularity.  LABORATORY AND X-RAY DATA:  Labs obtained January 14 include normal electrolytes with potassium 4.2, creatinine 0.9, and normal liver function tests including bilirubin 0.5 and alkaline phosphatase 59.  CA-125 value was 24 (stable).  Hemogram includes WBC 6200, hematocrit 39.6%, and platelets 199,000.  ASSESSMENT:  Abdominal pain probably related to perisplenic cyst from recurrent ovarian cancer, borderline  histology.  PLAN AND RECOMMENDATIONS:  I had a lengthy discussion with the patient regarding our treatment options.  I would favor obtaining a CT scan at this juncture to evaluate her perisplenic cyst; if this remains a simple cyst and is enlarging towards 10  cm, I would try to set up a catheter drainage of the cyst followed by direct intracystic treatment with cisplatin 50 mg.  We have had some success with effusion control and treating loculated ascites using this approach in other patients, and toxicity is minimal.  We again discussed the other option of a surgical exploration, but she is reluctant to consider this.  CT scan will be obtained today. DD:  11/06/00 TD:  11/06/00 Job: [id number]

## 2011-03-03 NOTE — Consult Note (Signed)
NAME:  [name], [name]                         ACCOUNT NO.:  [id number]   MEDICAL RECORD NO.:  [id number]                   PATIENT TYPE:  OUT   LOCATION:  GYN                                  FACILITY:  [facility]   PHYSICIAN:  John T. Kyla Balzarine, M.D.                 DATE OF BIRTH:  13-Jul-1961   DATE OF CONSULTATION:  05/05/2003  DATE OF DISCHARGE:                                   CONSULTATION   CHIEF COMPLAINT:  Followup of low malignant potential lesion of the ovary  with perisplenic cyst.   INTERVAL HISTORY:  The patient has done relatively well since she was last  seen, with no pain. She does note dragging ache in the left upper quadrant  of the abdomen which has continued to become most prominent late in the day.  Overall, she notes the symptoms have been progressively increasing over the  past several months, overall rating her discomfort as a 3 or 4/10. She is  taking no analgesics for the pain.   HISTORY OF PRESENT ILLNESS:  Dates to 72 when she was debulked of stage 3B  ovarian cancer of low malignant potential with TAH/BSO and omentectomy. She  received cisplatin/Cytoxan chemotherapy but relapsed in the pelvis and upper  abdomen with sigmoid colon and liver involvement. She was treated with  rectosigmoid resection/reanastomosis in 1966 and received Taxol/platin  chemotherapy. She developed an allergic reaction to Taxol and was  subsequently switched to Taxotere. She had disease progression and was  changed to topotecan having clearance of all of her liver lesions. Since  March 2000, she has been known to have a persistent perisplenic cyst. We  have managed this with sequential drainage and on several occasions she has  had positive cytology from the cyst. In February 2002, she received  intracystic cisplatin but then required cyst drainage in June 2003 despite  that treatment.   PAST MEDICAL HISTORY:  No major comorbidities and no other surgeries.   CURRENT MEDICATIONS:   Premarin 1.25 mg daily, multivitamins.   ALLERGIES:  PAXIL, TAXOL.   PERSONAL SOCIAL HISTORY:  Married, denies tobacco and admits social ethanol.   FAMILY HISTORY:  Pancreatic cancer in her mother.   REVIEW OF SYMPTOMS:  Otherwise noncontributory.   PHYSICAL EXAMINATION:  VITAL SIGNS:  Stable and afebrile as recorded in the  clinic flow sheet. Weight 120 pounds.  GENERAL:  The patient is alert and oriented x3 in no acute distress.  ENT:  Benign with clear oropharynx.  NECK:  Supple without goiter.  LYMPHS:  Surveyed, no pathologic lymphadenopathy.  BACK:  No back or CVA tenderness.  ABDOMEN:  Soft and benign with no palpable mass, hernia, or organomegaly.  There is no ascites and no abdominal tenderness. The midline incision is  well healed without hernias.  EXTREMITIES:  Full range of motion with no edema.  PELVIC:  External genitalia and BUS are normal  to inspection and palpation.  The bladder and urethra are well supported. The vaginal mucosa is clear.  Bimanual and rectovaginal examinations reveal absent uterus and cervix  without mass or nodularity.   LABORATORY DATA:  CA 125 value obtained June 1 elevated at 20.6 (previous  34) but different assay. CT of abdomen and pelvis performed Mar 06, 2003 is  available for review revealing a large splenic cyst with slight rim  nodularity measuring 10.3 x 10.7 cm in size, essentially stable. There is a  very small cystic lesion within the dome of the liver anteriorly with rim  calcification appears stable. No ascites, retroperitoneal or mesenteric  adenopathy. Stable small bilateral inguinal nodes.   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   PLAN:  I again had a discussion with Alita and her husband regarding  management. We discussed the possibility of splenectomy versus continued  management with intermittent scan directed drainage. We will make  arrangements for this to be performed in  the near future at [facility]. We will continue to monitor her at six months intervals  clinically.                                               John T. Kyla Balzarine, M.D.    JTS/MEDQ  D:  05/05/2003  T:  05/05/2003  Job:  [id number]   cc:   Telford Nab, R.N.  [address]  [place], [place] [address]   Carlus Pavlov, [facility]  [address]  [place], [place] [address]

## 2011-03-03 NOTE — Consult Note (Signed)
NAME:  [name], [name]                         ACCOUNT NO.:  [id number]   MEDICAL RECORD NO.:  [id number]                   PATIENT TYPE:  OUT   LOCATION:  GYN                                  [facility]ACILITY[facility]  [facility]   PHYSICIAN:  John T. Soper, M.D.                 DATE OF BIRTH:  1960-12-15   DATE OF CONSULTATION:  11/19/2002  DATE OF DISCHARGE:                                   CONSULTATION   CHIEF COMPLAINT:  Ongoing followup of low malignant potential cancer of the  ovary with a perisplenic cyst.   INTERVAL HISTORY:  Since she was last seen, she had a CA-125 value of 34  units/mL performed on 11/14/02.  She has minimal symptoms, consisting of a  dragging ache in the left upper quadrant of the abdomen which comes and  goes, although it is most prominent late in the day.  She additionally have  symptoms of gastroesophageal reflux disease, but these symptoms wax and wane  depending on diet, and she has noted no significant increase in frequency of  reflux type symptoms since she was last seen.  She denies other change in  bowel or bladder functions.  She has no functional limitations.  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  She  received cisplatin/Cytoxan chemotherapy, but relapsed in the pelvis and  upper abdomen with sigmoid colon and liver involvement.  She was treated  with rectosigmoid resection/anastomosis in 1996, and subsequent Taxol/platin  chemotherapy.  She developed an allergic reaction to Taxol, and subsequently  received Taxotere.  She had apparent disease progression and was changed to  topotecan, having a disappearance of all of her liver lesions.  She was  followed without evidence of disease with the exception of a persistent  perisplenic cyst, managed since 3/00, with sequential drainage of the cyst.  On several occasions, she has had positive cytology from this cyst.  In  2/02, she received intracystic cisplatin, but required cyst drainage in  6/03, and despite that treatment.  Of note, cytology from that procedure was  negative.   PAST MEDICAL HISTORY:  No major comorbidities and no other surgeries.   CURRENT MEDICATIONS:  1. Conjugated Estrogen 1.25 mg daily.  2. Multivitamins.   ALLERGIES:  PAXIL   SOCIAL HISTORY:  Married, denies tobacco, admits to social ethanol.   FAMILY HISTORY:  Pancreatic cancer in her mother.   REVIEW OF SYMPTOMS:  Otherwise noncontributory.   PHYSICAL EXAMINATION:  VITAL SIGNS:  Weight 119 pounds, blood pressure  116/68, otherwise stable and afebrile.  GENERAL:  The patient is alert and oriented x3, in no acute distress.  HEENT:  Examination is benign with clear oropharynx.  NECK:  Supple without goiter.  LYMPH NODE:  No pathologic lymphadenopathy.  BACK:  No back or CVA tenderness.  ABDOMEN:  Soft and benign with no palpable mass, hernia, or organomegaly.  There is no ascites and no abdominal tenderness.  Midline incision well-  healed without hernias.  EXTREMITIES:  Full range of motion with no edema.  PELVIC:  External genitalia and BUS are normal to inspection and palpation.  The bladder and urethra are well supported.  Vaginal mucosa is clear.  Bimanual and rectovaginal examinations reveal absent uterus and cervix  without mass or nodularity.   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   PLAN:  The patient appears to be doing well.  She will continue Premarin.  In the absence of progressive symptoms, we will repeat a CT scan and CA-125  value, re-evaluating her next summer.                                               John T. Kyla Balzarine, M.D.    JTS/MEDQ  D:  11/19/2002  T:  11/19/2002  Job:  [id number]   cc:   Leona Singleton, M.D.  [address]  [place]  [place] [address]  Fax: [phone]   Telford Nab, R.N.  [address] [place], [place] [address]  Fax: 1

## 2011-03-03 NOTE — Consult Note (Signed)
[facility]  Patient:    [name], [name] Visit Number: [id number] MRN: [id number]          Service Type: GON Location: GYN Attending Physician:  Sabino Donovan Dictated by:   Jackquline Denmark. Kyla Balzarine, M.D. Proc. Date: 07/30/01 Admit Date:  07/30/2001   CC:         [name], M.D., [facility],  [address] [place], [place]  [address]  Telford Nab, R.N.   Consultation Report  REASON FOR CONSULTATION:  [name] returns for ongoing followup of a low-malignant-potential lesion of the ovary, persistent.  INTERVAL HISTORY:  Since she was last seen, she has minimal left upper quadrant fullness with no pain other than slight discomfort when she tries to sleep on her left side.  She notes no early satiety.  She has no obstructive-type symptoms, nausea or vomiting or abdominal bloating.  She continues to be active without limitations in functional activity.  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  She received cisplatin/Cytoxan chemotherapy but relapsed in the pelvis and upper abdomen with sigmoid and liver involvement.  She was treated with rectosigmoid resection/reanastomosis in 1996 and treated with Taxol/platin chemotherapy initially but developed an allergic reaction to Taxol and subsequently received Taxotere and cisplatin.  She was then treated with six cycles of topotecan following apparent disease progression.  After the topotecan, she normalized CA125 values but in February 1999, she developed a perisplenic cyst approximately 7 cm in diameter.  Biopsy via fine-needle aspirate revealed cells compatible with a borderline lesion.  She has required drainage of her perisplenic cyst in March 2000 and March 2001 when her cyst enlarged to 10 cm. Cytopathology has been intermittently benign.  In February 2002, her cyst was again  enlarged and she was admitted to [facility] where she underwent CT-directed catheter placement and received a single course of intracystic cisplatin.  The patient has done quite well since and recently had a full set of labs including CA125 obtained by Dr. Harl Shelton, with all values essentially normal.  PAST MEDICAL HISTORY:  Past medical history is significant for no major co-morbidities and no other surgeries.  CURRENT MEDICATIONS: 1. Premarin 1.25 mg q.d. 2. Multivitamins.  ALLERGIES:  TAXOL.  PERSONAL/SOCIAL HISTORY:  The patient is married, denies tobacco and admits rare ethanol.  FAMILY HISTORY:  Family history is significant for pancreatic cancer in her mother.  REVIEW OF SYSTEMS:  Essentially negative with general, systemic, ENT, cardiopulmonary, GI/GU or GYN complaints except as noted above.  PHYSICAL EXAMINATION:  VITAL SIGNS:  Stable and afebrile with weight 117.5 pounds.  GENERAL:  The patient is alert and oriented x 3, in no acute distress.  ENT:  Benign.  NODES:  There is no supraclavicular or inguinal adenopathy.  LUNGS:  Lung fields are clear.  BACK:  There is no back or CVA tenderness.  ABDOMEN:  Soft and benign with no palpable mass, hernia or organomegaly.  No fluid wave or gross ascites are present.  There is no abdominal tenderness.  EXTREMITIES:  Full range of motion.  There is no edema.  PELVIC:  External genitalia and BUS are normal to inspection and palpation. The bladder and urethra are well-supported and vaginal mucosa is clear. Bimanual and rectovaginal examinations reveal absent uterus and cervix without mass or nodularity.  LABORATORY DATA:  Labs obtained July 08, 2001 are reviewed including  normal electrolytes with potassium 3.8, creatinine 1.0.  Liver function tests were normal with albumin 4.3 and cholesterol levels normal.  Hemogram included total WBC 6400, hematocrit 40.6 and platelets 303,000.  CA125 value was  34.0.  ASSESSMENT:  Recurrent borderline/low-malignant-potential lesion of the ovary.  PLAN:  Patient appears to be doing quite well at present and was reassured regarding her current examination.  Of note, CA125 value recently was 34, technically normal but beginning to creep up.  Because the patient is relatively asymptomatic, however, we will set up labs and CT of abdomen and pelvis in approximately five to six months.  We would certainly see her sooner should she develop increasing pain or other symptoms. Dictated by:   Jackquline Denmark. Kyla Balzarine, M.D. Attending Physician:  Ronita Hipps T DD:  07/30/01 TD:  07/30/01 Job: [id number]

## 2011-03-03 NOTE — Consult Note (Signed)
[name], [name]               ACCOUNT NO.:  [id number]   MEDICAL RECORD NO.:  [id number]          PATIENT TYPE:  OUT   LOCATION:  GYN                          FACILITY:  [facility]   PHYSICIAN:  John T. Kyla Balzarine, M.D.    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  After TAH/BSO and  omentectomy, she received cisplatin/Cytoxan chemotherapy.  She subsequently  relapsed in the pelvis and upper abdomen with sigmoid colon and liver  involvement.  This was treated surgically with rectosigmoid resection and  reanastomosis in 1996.  She received Taxol and cisplatin chemotherapy, but  developed an allergic reaction to Taxol and was changed to Taxotere.  She  then had disease progression, with liver lesions, but had a complete  response to topotecan, clearing all of her liver lesions.  Since 2000, she  has had a persistent/recurrent perisplenic cyst managed with p.r.n.  drainage.  On several occasions, she had positive cytology from the cyst.  The cyst has been treated with intracystic cisplatin, had percutaneous tube  placement with sclerosis using doxycycline in 2004, and in June 2006 had  alcohol injection after cyst aspiration.  Cytology from that aspiration was  positive for atypical papillary fragments.  Interval CA-125 value was 25.2,  with a value of 39.6 at the time of her cyst aspiration.  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  She will occasionally have a catch in the left upper  quadrant, but denies progressive pressure, early satiety, GERD symptoms, or  pain.  She is aware of her aortic  pulsation in the epigastrium.  Bowel and  bladder functions are otherwise normal.  She developed dependent edema when  traveling to [place], but no recurrent edema.   PAST MEDICAL HISTORY:  No other comorbidities or surgeries.   CURRENT MEDICATIONS:  1.  Premarin 1.25 mg daily.  2.  Multivitamins.   ALLERGIES:  1.  PAXIL.  2.  TAXOL.   PERSONAL AND SOCIAL HISTORY:  Married.  Denies tobacco, and admits social  ethanol.   FAMILY HISTORY:  Mother had pancreatic cancer.   REVIEW OF SYSTEMS:  Otherwise negative in comprehensive 10-point review of  systems.   PHYSICAL EXAMINATION:  VITAL SIGNS:  Weight 118 pounds, blood pressure  118/68.  GENERAL:  The patient is alert and oriented x3, in no acute distress.  LYMPHATIC:  There is no pathologic lymphadenopathy.  MUSCULOSKELETAL:  There is no back or CVA tenderness.  ABDOMEN:  Soft and benign, with no ascites, mass, or organomegaly.  No  tenderness elicited.  Midline incision well healed, without hernia.  EXTREMITIES:  Full strength and range of motion, with no cords, Homans'  sign, or edema.  PELVIC:  External genitalia and  BUS are normal.  Bladder and urethra are  normal.  Vagina is clear, without mucosal lesions.  Bimanual and  rectovaginal examinations disclose absent uterus and cervix, without mass or  nodularity.   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   PLAN:  We will continue to follow the patient at annual intervals for  routine clinical followup.  Should she have CA-125 values rising above 40  for symptoms, we would obtain ultrasound and would advocate drainage of any  enlarging.  perisplenic cyst.  The patient is in agreement with this plan.  CA-125 will be obtained today, and will have Dr. Laureen Ochs obtain a CA-125  locally in approximately 6 months.      John T. Kyla Balzarine, M.D.  Electronically Signed     JTS/MEDQ  D:  03/21/2006  T:  03/22/2006  Job:   [id number]   cc:   Telford Nab, R.N.  [address]  [place], [place] [address]   Worthy Keeler., M.D.  [address]  [place] [address]

## 2011-03-03 NOTE — Consult Note (Signed)
NAME:  [name], [name]                         ACCOUNT NO.:  [id number]   MEDICAL RECORD NO.:  [id number]                   PATIENT TYPE:  OUT   LOCATION:  GYN                                  FACILITY:  [facility]   PHYSICIAN:  John T. Kyla Balzarine, M.D.                 DATE OF BIRTH:  Apr 16, 1961   DATE OF CONSULTATION:  DATE OF DISCHARGE:                                   CONSULTATION   CHIEF COMPLAINT:  Follow up of recurrent LMP ovarian cancer with splenic  cyst.   INTERVAL HISTORY:  The patient has moderated her diet and has noted a change  in her symptoms of reflux, with decreasing reflux and early satiety.  She  notes an occasional pulling sensation when performing yoga or golf.  This  has localized in the left upper quadrant maximizing as a  2 out of 10 but she is on no medications for pain.  She denies early satiety  and relates normal bowel function.  She denies obstructive type symptoms,  fever, chills or weight loss.   HISTORY OF PRESENT ILLNESS:  In 1992 the patient was debulked to stage 3B  ovarian malignancy with borderline histology.  Following TAH/BSO and  omentectomy, she received cisplatin/Cytoxan chemotherapy.  She relapsed in  the pelvis and upper abdomen with sigmoid colon and liver involvement,  treated surgically with rectosigmoid resection/anastomosis in 1996.  She  received Taxol and cisplatin chemotherapy but developed an allergic reaction  to Taxol and was switched to Taxotere.  She then had disease progression but  eventually a complete response to topotecan, clearing all of her liver  lesions.  Since 2000, she has had a persistent perisplenic cyst managed with  p.r.n. drainage.  On several occasions, she had positive cytology from the  cyst.  The cyst has been treated with intracystic cisplatin and had  percutaneous tube placement with sclerosis using doxycycline in July, 2004.  The patient's most recent CT scan in April, 2005 revealed a slightly smaller  splenic  cyst.   PAST MEDICAL HISTORY:  No other major comorbidities or surgeries.   CURRENT MEDICATIONS:  1. Premarin 1.25 mg daily.  2. Multivitamins.   ALLERGIES:  PAXIL AND TAXOL.   PERSONAL SOCIAL HISTORY:  Married, denies tobacco and admits to social  ethanol.  Just beginning nursing school.   FAMILY HISTORY:  Pancreatic cancer in her mother.   REVIEW OF SYSTEMS:  Otherwise noncontributory.   PHYSICAL EXAMINATION:  VITAL SIGNS:  Weight 120 pounds; vital signs stable  with blood pressure 126/80.  GENERAL:  The patient is alert and oriented x3, in no acute distress.  LYMPHATIC SURVEY:  No pathologic lymphadenopathy.  BACK:  No back or CVA tenderness.  ABDOMEN:  Soft and benign with no mass, hernia or organomegaly. There is no  ascites and no abdominal tenderness.  Midline incision well-healed without  hernias.  EXTREMITIES:  Full range of motion  without edema.  PELVIC:  External genitalia and BUS are normal to inspection on palpation.  The bladder and urethra are well supported.  Vaginal mucosa is clear.  Bimanual and rectovaginal examinations reveal absent uterus and cervix  without mass or nodularity.   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   PLAN:  CA-125 is obtained and unless this is doubling, we will see her back  for follow up in 6 months to repeat a CT and a CA-125.                                               John T. Kyla Balzarine, M.D.    JTS/MEDQ  D:  06/07/2004  T:  06/07/2004  Job:  [id number]   cc:   Telford Nab, R.N.  [address]  [place], [place] [address]   Reatha Harps. Laureen Ochs, M.D.  [address] [place], [place]. [address]

## 2011-03-03 NOTE — Consult Note (Signed)
NAMELEILY, [name]               ACCOUNT NO.:  [id number]   MEDICAL RECORD NO.:  [id number]          PATIENT TYPE:  OUT   LOCATION:  GYN                          FACILITY:  [facility]   PHYSICIAN:  John T. Kyla Balzarine, M.D.    DATE OF BIRTH:  01-22-1961   DATE OF CONSULTATION:  DATE OF DISCHARGE:                                   CONSULTATION   CHIEF COMPLAINT:  Follow-up of LMP tumor of the ovary.   HISTORY OF PRESENT ILLNESS:  This patient has stage IIID ovarian malignancy  optimally debulked in 1992 with borderline histology.  After TAH/BSO and  omentectomy, she received cisplatin/Cytoxan chemotherapy.  She had an  invasive recurrence in pelvis and upper abdomen treated with rectosigmoid  resection and reanastomosis in 1996.  She received Taxol and cisplatin  chemotherapy but, after an allergic reaction to TAXOL, was changed to  Taxotere.  She had disease progression of liver lesions but a complete  response topotecan.  Since 2000, she has had a persistent/recurrent  perisplenic cyst managed with p.r.n. drainage.  On several occasions, she  has had positive cytology from the cyst.  She has been treated with  intracystic cisplatin, had percutaneous tube placement with sclerosis using  doxycycline and alcohol injection after cyst aspiration.  The patient notes  recent increase in symptoms of left upper quadrant pressure, early satiety,  and occasional grabbing pain.  She denies nausea or vomiting or  persistent, continuous pain.  She denies pelvic symptoms.  It should be  noted that her CA-125 in June had risen to 45.5.   PAST HISTORY/PERSONAL/SOCIAL HISTORY AND FAMILY HISTORY:  Are reviewed and  unchanged from those recorded in June 2007.   MEDICATIONS:  Include Premarin and multivitamins.   ALLERGIES:  PAXIL and TAXOL.   REVIEW OF SYSTEMS:  Other than noted above, negative in comprehensive 10-  point review of systems.   EXAM:  Weight 119 pounds, blood pressure 118/72.  The patient  is alert and  oriented times 3, in no acute distress.  No pathologic lymphadenopathy on  lymph node survey.  LUNG FIELDS:  Clear.  No back or CVA tenderness.  EXTREMITIES:  Have full strength and range of motion.  ABDOMINAL EXAMINATION:  Reveals no ascites, tenderness or hernia.  There is  fullness in the left upper quadrant but no distinct mass.   ASSESSMENT:  Likely recurrent perisplenic cyst related to LMP tumor of the  ovary.   PLAN:  The patient will have a CA-125 value today and, if persistently  rising, we would pursue a repeat scan and drainage.      John T. Kyla Balzarine, M.D.  Electronically Signed     JTS/MEDQ  D:  07/24/2006  T:  07/25/2006  Job:  [id number]   cc:   Telford Nab, R.N.  [address]  [place], [place] [address]   Reatha Harps., M.D., [address] [place], [place] [address] Laureen Ochs

## 2011-03-03 NOTE — H&P (Signed)
[facility]  Patient:    AMAZIN, PINCOCK                      MRN: [id number] Adm. Date:  [id number] Attending:  Sabino Donovan CC:         Harl Bowie, M.D. - [facility]             [address] [place], [place], [place] [address]             Telford Nab, N.P. - [facility]                         History and Physical  CHIEF COMPLAINT:  Ms. Carollo returns for ongoing followup of persistent borderline tumor of the ovary.  Since she was last seen, she has minimal left upper quadrant fullness with no discomfort.  She notes rare early satiety.  She has no obstructive-type symptoms, pelvic pain, or gross ascites.  She continues to be quite active without limitations in functional activity.  HISTORY OF PRESENT ILLNESS:  This patient had a stage IIIB borderline tumor of he ovary debulked in 1992, with a TAH/BSO and omentectomy.  She received cisplatin/Cytoxan chemotherapy but relapsed in the pelvis and upper abdomen with the sigmoid colon and liver involvement.  She was treated with a rectosigmoid resection/reanastomosis in 1996, treated with Taxol/platin chemotherapy initially, but developed an allergic reaction to Taxol, and subsequently received Taxotere and cisplatin.  She was then treated with six cycles of topotecan following apparent disease progression with new hepatic lesions.  After the topotecan, she normalized her CA125 value.  In February 1999, she developed a perisplenic cyst approximately 5.0 to 7.0 cm in diameter.  Biopsy via a fine needle aspirate revealed cells compatible with a borderline lesion.  In March 2000, her CA125 value increased o 52, and she had drainage of her perisplenic cyst.  In March 2001, she underwent  another CT drainage when her cyst had enlarged to 10.0 cm.  The cytopathology at that time was benign.  PAST MEDICAL HISTORY:  The patient has no medical comorbidities and no  other surgery.  CURRENT MEDICATIONS: 1. Premarin 1.25 mg q.d. 2. Multivitamins.  ALLERGIES:  TAXOL.  PERSONAL/SOCIAL HISTORY:  The patient is married.  Denies tobacco and admits rare ethanol.  FAMILY HISTORY:  Significant for pancreatic cancer in her mother.  REVIEW OF SYSTEMS:  Essentially negative without general systemic, ENT, cardiopulmonary, GI, GU, or GYN complaints, except as noted above.  PHYSICAL EXAMINATION:  VITAL SIGNS:  Stable and afebrile, and recorded in the clinic chart.  GENERAL:  The patient is alert and oriented x 3, in no acute distress.  HEENT:  Benign, and clear oropharynx.  NECK:  Supple, without goiter.  LUNGS:  Fields are clear.  ABDOMEN:  Soft, benign, without ascites or mass.  The midline incision is well-healed, without hernia, mass, or ascites.  There is no abdominal tenderness.  BACK:  There is no back or CVA tenderness.  There is no pathologic lymphadenopathy, and no lymph edema.  GENITOURINARY/PELVIC/RECTAL:  External genitalia and BUS are normal to inspection and palpation.  The bladder and urethra are well-supported.  There are no vaginal mucosal lesions.  Bimanual and rectovaginal examinations reveal minimal induration of the vaginal cuff, without mass or nodularity.  LABORATORY DATA:  Recent labs include a CA125 of 18.3, normal hemikaryon and metabolic panel including renal and liver function tests.  ASSESSMENT:  Borderline lesion of the ovary with perisplenic cysts.  PLAN:  I had another discussion with the patient regarding alternative management strategies of her cyst.  We discussed open surgery or catheter drainage, followed by sclerosis.  I would recommend followup at three-month intervals.  I recommended that she establish a relationship with a family physician, and she has identified Dr. Harl Bowie, closer to her home.  She could have a CA125, CBC, and extended chemistry panel obtained prior to her return to  see me at three-month intervals. DD:  04/11/00 TD:  04/11/00 Job: [id number]

## 2011-03-03 NOTE — Consult Note (Signed)
[name], [name]               ACCOUNT NO.:  [id number]   MEDICAL RECORD NO.:  [id number]          PATIENT TYPE:  OUT   LOCATION:  GYN                          FACILITY:  [facility]   PHYSICIAN:  John T. Kyla Balzarine, M.D.    DATE OF BIRTH:  26-Apr-1961   DATE OF CONSULTATION:  01/30/2007  DATE OF DISCHARGE:                                 CONSULTATION   CHIEF COMPLAINT:  Follow-up of invasive LMP tumor of the ovary.   HISTORY OF PRESENT ILLNESS:  This patient has stage III-C ovarian  malignancy optimally debulked in 1992 with borderline histology.  After  TAH/BSO and omentectomy, she received cisplatin/Cytoxan chemotherapy.  She developed an invasive recurrence in the pelvis and upper abdomen  treated with rectosigmoid resection and reanastomosis in 1996.  She  received Taxol and cisplatin chemotherapy and was changed to Taxotere  because of the an allergic reaction.  She had disease progression in the  liver but a complete response to topotecan.  Since 2000, she has had a  persistent/recurrent perisplenic cyst managed with p.r.n. drainage.  Last drainage was performed in July 25, 2006 and pathology again  revealed rare papillary epithelial component.  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  She has rare symptoms of  fullness in the left upper quadrant but no pain, early satiety, nausea  or vomiting.   Past history, personal social history and family reviewed and unchanged  from those recorded in June 2007, with the bulk of her past history  revolving around ovarian cancer care.  Her mother was a long-term  survivor with pancreatic cancer.   MEDICATIONS:  Premarin and multivitamins.   ALLERGIES:  PAXIL and TAXOL.   REVIEW OF SYSTEMS:  Other than above, negative in a comprehensive 10-  point review of systems.   PHYSICAL EXAMINATION:  VITAL SIGNS:  Stable and afebrile as recorded in  the clinic flow sheet with weight 121 pounds.  LYMPH NODES:  Lymph  survey reveals no pathologic lymphadenopathy.  BACK:  No back or CVA tenderness.  ABDOMEN:  Soft and benign with no ascites, tenderness or hernia.  No  distinct fullness or mass in the left upper quadrant.  EXTREMITIES:  Full strength and range of motion without edema.  PELVIC:  External genitalia, BUS, bladder/urethra, and vagina are clear.  Bimanual and rectovaginal examinations disclose absent uterus and  cervix, no mass.   ASSESSMENT:  Persistent LMP tumor of the ovary.   PLAN:  CA125 value will be obtained.  Unless the patient is having a  rapid rise or symptoms, we can see her back for followup in 6 months.  I  would plan to schedule a CT scan at time of her return.      John T. Kyla Balzarine, M.D.  Electronically Signed     JTS/MEDQ  D:  01/30/2007  T:  01/30/2007  Job:  [id number]   cc:   Telford Nab, R.N.  [address]  [place], [place] [address]   Leona Singleton, M.D.  Fax: [phone]

## 2011-03-03 NOTE — Consult Note (Signed)
NAME:  [name], [name]                         ACCOUNT NO.:  [id number]   MEDICAL RECORD NO.:  [id number]                   PATIENT TYPE:  OUT   LOCATION:  GYN                                  [facility]ACILITY[facility]   PHYSICIAN:  John T. Soper, M.D.                 DATE OF BIRTH:  04-Oct-1961   DATE OF CONSULTATION:  12/30/2003  DATE OF DISCHARGE:                                   CONSULTATION   CHIEF COMPLAINT:  Follow up of low malignant potential tumor of the ovary  with splenic cyst.   INTERVAL HISTORY:  In August 2004, the patient underwent percutaneous  drainage catheter placement in her perisplenic cyst and was treated with  doxycycline sclerosis.  She has had no pain but does note symptoms for early  satiety and reflux, particularly when she eats later in the evening.  She  notes a pulling sensation when performing yoga or golf in the left upper  quadrant, rating her discomfort as a 2 to 3 out of 10.  She is taking no  medications for pain.  Bowel function is otherwise normal and she denies  obstructive type symptoms.   HISTORY OF PRESENT ILLNESS:  In 1992, the patient was debulked of a stage  IIIB ovarian cancer of low malignant potential with TAH/BSO and omentectomy.  There was gross residual disease and she received cisplatin/Cytoxan  chemotherapy.  She relapsed in the pelvis and upper abdomen with sigmoid  colon and liver involvement, treated surgically with rectosigmoid  resection/reanastomosis in 19__________.  She received TAXOL and cisplatin  chemotherapy but developed an ALLERGIC reaction to TAXOL and was  subsequently switched to Taxotere.  She then had disease progression but had  a complete response to topotecan, clearing all of her liver lesions.  Since  2000, she has had a persistent perisplenic cyst managed with sequential  drainage.  On several occasions, she has had positive cytology from the  cyst.  In February 2002, she received intracystic cisplatin but then  required a repeat cyst drainage in June 2003 and in July 2004 percutaneous  tube placement and sclerosis with doxycycline.   PAST MEDICAL HISTORY:  No other major comorbidities or surgeries.   CURRENT MEDICATIONS:  Premarin 1.25 mg daily and multivitamins.   ALLERGIES:  PAXIL and TAXOL.   PERSONAL SOCIAL HISTORY:  Married, denies tobacco and admits social ethanol.   FAMILY HISTORY:  Pancreatic cancer in her mother.   REVIEW OF SYSTEMS:  Otherwise noncontributory.   PHYSICAL EXAMINATION:  VITAL SIGNS:  Weight 120 pounds, vital signs stable  and afebrile.  GENERAL:  The patient is alert and oriented x3 in no acute distress.  LYMPH SURVEY:  No pathologic lymphadenopathy.  BACK:  No back or CVA tenderness.  ABDOMEN:  Soft and benign with no mass, hernia, or organomegaly.  There is  no ascites and no abdominal tenderness.  Midline incision well healed  without hernias.  EXTREMITIES:  Full range of motion without edema.  PELVIC:  External genitalia and BUS are normal to inspection and palpation.  The bladder and urethra are well supported.  Vaginal mucosa is clear.  Bimanual and rectovaginal examinations reveal absent uterus and cervix  without mass or nodularity.   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  Reflux symptoms suggesting recurrent  perisplenic cysts.   PLAN:  CA-125 and chemotherapy follow up labs are obtained.  We will setup a  CT of abdomen and pelvis to reassess her disease status and the status of  her perisplenic cyst.  I recommended an empiric trial of Reglan 10 mg post  meals and at h.s. in an attempt to improve gastric emptying and GERD.  Disposition will depend on the results of these tests.                                               John T. Kyla Balzarine, M.D.    JTS/MEDQ  D:  12/30/2003  T:  01/01/2004  Job:  [id number]   cc:   Telford Nab, R.N.  [address]  [place], [place] [address]    Worthy Keeler., M.D.  [facility]  [address]  [place], [place] [address]

## 2011-07-27 LAB — CA 125: CA 125: 39.8 — ABNORMAL HIGH

## 2012-03-22 ENCOUNTER — Other Ambulatory Visit: Payer: Self-pay | Admitting: Gynecologic Oncology

## 2012-03-22 DIAGNOSIS — D734 Cyst of spleen: Secondary | ICD-10-CM

## 2012-03-28 ENCOUNTER — Other Ambulatory Visit (HOSPITAL_COMMUNITY): Payer: Self-pay

## 2012-03-28 ENCOUNTER — Ambulatory Visit (HOSPITAL_COMMUNITY): Payer: Self-pay

## 2012-04-01 ENCOUNTER — Other Ambulatory Visit: Payer: Self-pay | Admitting: Radiology

## 2012-04-04 ENCOUNTER — Encounter (HOSPITAL_COMMUNITY): Payer: Self-pay

## 2012-04-04 ENCOUNTER — Ambulatory Visit (HOSPITAL_COMMUNITY)
Admission: RE | Admit: 2012-04-04 | Discharge: 2012-04-04 | Disposition: A | Discharge status: Home / Self Care | Payer: BC Managed Care – PPO | Source: Ambulatory Visit | Attending: Gynecologic Oncology | Admitting: Gynecologic Oncology

## 2012-04-04 ENCOUNTER — Other Ambulatory Visit: Payer: Self-pay | Admitting: Gynecologic Oncology

## 2012-04-04 DIAGNOSIS — D734 Cyst of spleen: Secondary | ICD-10-CM

## 2012-04-04 DIAGNOSIS — Z8543 Personal history of malignant neoplasm of ovary: Secondary | ICD-10-CM | POA: Insufficient documentation

## 2012-04-04 DIAGNOSIS — D7389 Other diseases of spleen: Secondary | ICD-10-CM | POA: Insufficient documentation

## 2012-04-04 HISTORY — DX: Malignant neoplasm of unspecified ovary: C56.9

## 2012-04-04 HISTORY — DX: Malignant neoplasm of small intestine, unspecified: C17.9

## 2012-04-04 MED ORDER — MIDAZOLAM HCL 2 MG/2ML IJ SOLN
INTRAMUSCULAR | Status: AC
  Filled 2012-04-04: qty 6

## 2012-04-04 MED ORDER — FENTANYL CITRATE 0.05 MG/ML IJ SOLN
INTRAMUSCULAR | Status: AC
  Filled 2012-04-04: qty 6

## 2012-04-04 MED ORDER — FENTANYL CITRATE 0.05 MG/ML IJ SOLN
INTRAMUSCULAR | Status: AC | PRN
  Administered 2012-04-04 (×2): 50 ug via INTRAVENOUS

## 2012-04-04 MED ORDER — MIDAZOLAM HCL 5 MG/5ML IJ SOLN
INTRAMUSCULAR | Status: AC | PRN
  Administered 2012-04-04 (×2): 1 mg via INTRAVENOUS

## 2012-04-04 NOTE — Discharge Instructions (Signed)
Biopsy  Care After  These instructions give you information on caring for yourself after your procedure. Your doctor may also give you more specific instructions. Call your doctor if you have any problems or questions after your procedure.  HOME CARE    Return to your normal diet and activities as told by your doctor.   Change your bandages (dressings) as told by your doctor. If skin glue (adhesive) was used, it will peel off in 7 days.   Only take medicines as told by your doctor.   Ask your doctor when you can bathe and get your wound wet.  GET HELP RIGHT AWAY IF:   You see more than a small spot of blood coming from the wound.   You have redness, puffiness (swelling), or pain.   You see yellowish-white fluid (pus) coming from the wound.   You have a fever.   You notice a bad smell coming from the wound or bandage.   You have a rash, trouble breathing, or any allergy problems.  MAKE SURE YOU:    Understand these instructions.   Will watch your condition.   Will get help right away if you are not doing well or get worse.  Document Released: 06/06/2011 Document Revised: 09/21/2011 Document Reviewed: 06/06/2011  [employer] Patient Information 2012 [employer]

## 2012-04-04 NOTE — ED Notes (Signed)
MD at bedside, made aware of patients VS. Patient asymptomatic. Ongoing monitoring.

## 2012-04-04 NOTE — H&P (Signed)
Chief Complaint: Hx of ovarian cancer, recurrent splenic cyst Referring Physician:Soper HPI: [name] is an 51 y.o. female with hx of ovarian CA who has had development of splenic and rt subcostal cysts develop. She has had these aspirated/drained in the past. They have now recurred and she is again scheduled for CT guided aspiration.   Past Medical History:  Past Medical History  Diagnosis Date  . Ovarian cancer     20 years ago  . 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 Abdominal hysterectomy   . Exploratory laparotomy w/ bowel resection     Family History: No family history on file.  Social History:  does not have a smoking history on file. She does not have any smokeless tobacco history on file. Her alcohol and drug histories not on file.  Allergies:  Allergies  Allergen Reactions  . Taxol (Paclitaxel) Anaphylaxis  . Demerol (Meperidine) Nausea And Vomiting    Medications: Multivitamin daily.  Please HPI for pertinent positives, otherwise complete 10 system ROS negative.  Physical Exam: There were no vitals taken for this visit. There is no height or weight on file to calculate BMI.   General Appearance:  Alert, cooperative, no distress, appears stated age  Head:  Normocephalic, without obvious abnormality, atraumatic  ENT: Unremarkable  Neck: Supple, symmetrical, trachea midline, no adenopathy, thyroid: not enlarged, symmetric, no tenderness/mass/nodules  Lungs:   Clear to auscultation bilaterally, no w/r/r, respirations unlabored without use of accessory muscles.  Chest Wall:  No tenderness or deformity  Heart:  Regular rate and rhythm, S1, S2 normal, no murmur, rub or gallop. Carotids 2+ without bruit.  Abdomen:   Soft, non-tender, non distended. Bowel sounds active all four quadrants,  no masses, no organomegaly.  Extremities: Extremities normal, atraumatic, no cyanosis or edema   Neurologic: Normal affect, no gross deficits.   No results found for this or any previous visit (from the past 48 hour(s)). No results found.  Assessment/Plan Recurrent splenic and rt subcostal cysts. Hx ovarian CA Scheduled for CT aspiration. Discussed procedure and risks with pt and husband. Consent signed in chart.  Brayton El PA-C 04/04/2012, 8:46 AM

## 2012-04-04 NOTE — H&P (Signed)
Agree 

## 2012-04-04 NOTE — Procedures (Signed)
Procedure:  CT guided aspiration of splenic cyst and right posterior subcostal fluid collections Findings:  Splenic cyst aspirated with 5 Fr Yueh centesis cath yielding 400 mL of fluid. Right subcostal collection aspirated with Yueh cath yielding 45 mL of fluid.

## 2015-01-19 ENCOUNTER — Other Ambulatory Visit (HOSPITAL_COMMUNITY): Payer: Self-pay | Admitting: Gynecologic Oncology

## 2015-01-19 DIAGNOSIS — C569 Malignant neoplasm of unspecified ovary: Secondary | ICD-10-CM

## 2015-01-26 ENCOUNTER — Ambulatory Visit (HOSPITAL_COMMUNITY): Payer: Self-pay

## 2015-02-26 ENCOUNTER — Other Ambulatory Visit: Payer: Self-pay | Admitting: Physician Assistant

## 2015-03-01 ENCOUNTER — Ambulatory Visit (HOSPITAL_COMMUNITY)
Admission: RE | Admit: 2015-03-01 | Discharge: 2015-03-01 | Disposition: A | Discharge status: Home / Self Care | Payer: 59 | Source: Ambulatory Visit | Attending: Gynecologic Oncology | Admitting: Gynecologic Oncology

## 2015-03-01 ENCOUNTER — Other Ambulatory Visit (HOSPITAL_COMMUNITY): Payer: Self-pay | Admitting: Interventional Radiology

## 2015-03-01 ENCOUNTER — Inpatient Hospital Stay
Admission: RE | Admit: 2015-03-01 | Discharge: 2015-03-01 | Disposition: A | Discharge status: Home / Self Care | Payer: Self-pay | Source: Ambulatory Visit | Attending: Interventional Radiology | Admitting: Interventional Radiology

## 2015-03-01 ENCOUNTER — Other Ambulatory Visit (HOSPITAL_COMMUNITY): Payer: Self-pay | Admitting: Gynecologic Oncology

## 2015-03-01 ENCOUNTER — Encounter (HOSPITAL_COMMUNITY): Payer: Self-pay

## 2015-03-01 DIAGNOSIS — D4959 Neoplasm of unspecified behavior of other genitourinary organ: Secondary | ICD-10-CM

## 2015-03-01 DIAGNOSIS — C569 Malignant neoplasm of unspecified ovary: Secondary | ICD-10-CM | POA: Diagnosis present

## 2015-03-01 LAB — BASIC METABOLIC PANEL
Anion gap: 10 (ref 5–15)
BUN: 16 mg/dL (ref 6–20)
CALCIUM: 10.1 mg/dL (ref 8.9–10.3)
CHLORIDE: 104 mmol/L (ref 101–111)
CO2: 28 mmol/L (ref 22–32)
Creatinine, Ser: 0.75 mg/dL (ref 0.44–1.00)
GFR calc Af Amer: >60 mL/min (ref >60)
GFR calc non Af Amer: >60 mL/min (ref >60)
GLUCOSE: 84 mg/dL (ref 65–99)
Potassium: 4 mmol/L (ref 3.5–5.1)
SODIUM: 142 mmol/L (ref 135–145)

## 2015-03-01 LAB — CBC
HCT: 41.8 % (ref 36.0–46.0)
HEMOGLOBIN: 14.2 g/dL (ref 12.0–15.0)
MCH: 28.3 pg (ref 26.0–34.0)
MCHC: 34 g/dL (ref 30.0–36.0)
MCV: 83.3 fL (ref 78.0–100.0)
PLATELETS: 188 10*3/uL (ref 150–400)
RBC: 5.02 MIL/uL (ref 3.87–5.11)
RDW: 12.5 % (ref 11.5–15.5)
WBC: 5.2 10*3/uL (ref 4.0–10.5)

## 2015-03-01 LAB — PROTIME-INR
INR: 0.98 (ref 0.00–1.49)
Prothrombin Time: 13.1 seconds (ref 11.6–15.2)

## 2015-03-01 MED ORDER — MIDAZOLAM HCL 2 MG/2ML IJ SOLN
INTRAMUSCULAR | Status: AC
  Filled 2015-03-01: qty 6

## 2015-03-01 MED ORDER — FENTANYL CITRATE (PF) 100 MCG/2ML IJ SOLN
INTRAMUSCULAR | Status: AC
Start: 2015-03-01 — End: 2015-03-01
  Filled 2015-03-01: qty 4

## 2015-03-01 MED ORDER — HYDROCODONE-ACETAMINOPHEN 5-325 MG PO TABS: 1.0000 | ORAL_TABLET | ORAL | Status: DC | PRN

## 2015-03-01 MED ORDER — CEFAZOLIN SODIUM-DEXTROSE 2-3 GM-% IV SOLR
2.0000 g | INTRAVENOUS | Status: DC
  Filled 2015-03-01: qty 50

## 2015-03-01 MED ORDER — SODIUM CHLORIDE 0.9 % IV SOLN
INTRAVENOUS | Status: DC
Start: 2015-03-01 — End: 2015-03-02
  Administered 2015-03-01: 09:00:00 via INTRAVENOUS

## 2015-03-01 MED ORDER — MIDAZOLAM HCL 2 MG/2ML IJ SOLN
INTRAMUSCULAR | Status: AC | PRN
  Administered 2015-03-01 (×2): 1 mg via INTRAVENOUS
  Administered 2015-03-01: 0.5 mg via INTRAVENOUS
  Administered 2015-03-01: 1 mg via INTRAVENOUS
  Administered 2015-03-01: 0.5 mg via INTRAVENOUS
  Administered 2015-03-01: 1 mg via INTRAVENOUS
  Administered 2015-03-01 (×2): 0.5 mg via INTRAVENOUS

## 2015-03-01 MED ORDER — FENTANYL CITRATE (PF) 100 MCG/2ML IJ SOLN
INTRAMUSCULAR | Status: AC | PRN
  Administered 2015-03-01: 50 ug via INTRAVENOUS
  Administered 2015-03-01 (×2): 25 ug via INTRAVENOUS

## 2015-03-01 NOTE — H&P (Signed)
Chief Complaint: "I'm having another aspiration of my cysts"  Referring Physician(s): Soper,John  History of Present Illness: [name] is a 54 y.o. female with history of ovarian malignancy with recurrent splenic cystic fluid collection and other cystic recurrences including a right flank region posterior subcostal collection. She presents today for CT-guided aspiration of both the splenic cystic and right flank fluid collections. Her last aspiration procedure here occurred on 04/04/12.  Past Medical History  Diagnosis Date  . Ovarian cancer     20 years ago  . 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 Abdominal hysterectomy    . Exploratory laparotomy w/ bowel resection      Allergies: Taxol and Demerol  Medications: Prior to Admission medications   Medication Sig Start Date End Date Taking? Authorizing Provider  b complex vitamins tablet Take 1 tablet by mouth every morning.   Yes Historical Provider, MD  EVENING PRIMROSE OIL PO Take 1 tablet by mouth every morning.   Yes Historical Provider, MD  MAGNESIUM PO Take 150 mg by mouth every other day.   Yes Historical Provider, MD  Multiple Vitamin (MULTIVITAMIN WITH MINERALS) TABS Take 1 tablet by mouth every morning.    Yes Historical Provider, MD  Omega-3 Fatty Acids (FISH OIL) 1000 MG CAPS Take 1 capsule by mouth every morning.   Yes Historical Provider, MD  OVER THE COUNTER MEDICATION Take 1 each by mouth every morning. Viactive Chews.   Yes Historical Provider, MD  VITAMIN A PO Take 4,000 Units by mouth every morning.   Yes Historical Provider, MD    History reviewed. No pertinent family history.  History   Social History  . Marital Status: Married    Spouse Name: N/A  . Number of Children: N/A  . Years of Education: N/A   Social History Main Topics  . Smoking status: Never Smoker   . Smokeless tobacco: Not on file  . Alcohol Use: Yes     Comment:  social drinking-1-2xmonth  . Drug Use: No  . Sexual Activity: Not on file   Other Topics Concern  . None   Social History Narrative      Review of Systems  Constitutional: Negative for fever and chills.  Respiratory: Negative for cough and shortness of breath.   Cardiovascular: Negative for chest pain.  Gastrointestinal: Negative for nausea, vomiting and blood in stool.       Some fullness in epigastric /upper quadrant regions; occ reflux  Genitourinary: Negative for dysuria and hematuria.  Musculoskeletal: Negative for back pain.  Neurological: Negative for headaches.    Vital Signs: BP 110/70 mmHg  Pulse 59  Temp(Src) 97.7 F (36.5 C) (Oral)  Resp 18  Ht 5\' 4"  (1.626 m)  Wt 118 lb (53.524 kg)  BMI 20.24 kg/m2  SpO2 100%  Physical Exam  Constitutional: She is oriented to person, place, and time.  Thin white female in no acute distress  Cardiovascular: Normal rate and regular rhythm.   Pulmonary/Chest: Effort normal and breath sounds normal.  Abdominal: Soft. Bowel sounds are normal. There is no tenderness.  Musculoskeletal: Normal range of motion. She exhibits no edema.  Neurological: She is alert and oriented to person, place, and time.    Imaging: No results found.  Labs:  CBC:  Recent Labs  03/01/15 0835  WBC 5.2  HGB 14.2  HCT 41.8  PLT 188    COAGS:  Recent Labs  03/01/15 0835  INR 0.98    BMP: No results for input(s): NA, K, CL, CO2, GLUCOSE, BUN, CALCIUM, CREATININE, GFRNONAA, GFRAA in the last 8760 hours.  Invalid input(s): CMP  LIVER FUNCTION TESTS: No results for input(s): BILITOT, AST, ALT, ALKPHOS, PROT, ALBUMIN in the last 8760 hours.  TUMOR MARKERS: No results for input(s): AFPTM, CEA, CA199, CHROMGRNA in the last 8760 hours.  Assessment and Plan: [name] is a 54 y.o. female with history of ovarian malignancy(borderline tumor) with recurrent splenic cystic fluid collection and other cystic recurrences including a  right flank region posterior subcostal collection. She presents today for CT-guided aspiration of both the splenic cystic and right flank fluid collections. Her last aspiration procedure here occurred on 04/04/12. Details/risks of procedure, including but not limited to, internal bleeding, infection, injury to adjacent structures, discussed with patient and husband with their understanding and consent.   Signed: D. Rowe Robert 03/01/2015, 9:03 AM   I spent a total of 20 minutes face to face in clinical consultation, greater than 50% of which was counseling/coordinating care for CT-guided aspiration of splenic and right flank fluid collections

## 2015-03-01 NOTE — Procedures (Signed)
Procedure:  CT guided aspiration drainage of splenic and right flank cystic lesions Findings:  5 Fr Yueh catheter advanced into splenic cyst, yielding 350 mL of cloudy, brownish/red fluid. Separate 5 Fr Yueh catheter advanced into right flank/subcostal collection yielding 90 mL of dark brown fluid. Separate samples sent for cytologic analysis. No complications.

## 2015-03-01 NOTE — Discharge Instructions (Signed)
Needle Aspiration Care After These instructions give you information on caring for yourself after your procedure. Your doctor may also give you more specific instructions. Call your doctor if you have any problems or questions after your procedure. HOME CARE  Rest for 4 hours after your biopsy, except for getting up to go to the bathroom or as told.  Keep the places where the needles were put in clean and dry.  Do not put powder or lotion on the sites.  Do not shower until 24 hours after the test. Remove all bandages (dressings) before showering.  Remove all bandages at least once every day. Gently clean the sites with soap and water. Keep putting a new bandage on until the skin is closed. Finding out the results of your test Ask your doctor when your test results will be ready. Make sure you follow up and get the test results. GET HELP RIGHT AWAY IF:   You have shortness of breath or trouble breathing.  You have pain or cramping in your belly (abdomen).  You feel sick to your stomach (nauseous) or throw up (vomit).  Any of the places where the needles were put in:  Are puffy (swollen) or red.  Are sore or hot to the touch.  Are draining yellowish-white fluid (pus).  Are bleeding after 10 minutes of pressing down on the site. Have someone keep pressing on any place that is bleeding until you see a doctor.  You have any unusual pain that will not stop.  You have a fever. If you go to the emergency room, tell the nurse that you had a biopsy. Take this paper with you to show the nurse. MAKE SURE YOU:   Understand these instructions.  Will watch your condition.  Will get help right away if you are not doing well or get worse. Document Released: 09/14/2008 Document Revised: 12/25/2011 Document Reviewed: 09/14/2008 [facility] Patient Information 2015 [place], [place]. This information is not intended to replace advice given to you by your health care provider. Make sure you  discuss any questions you have with your health care provider.    Conscious Sedation Sedation is the use of medicines to promote relaxation and relieve discomfort and anxiety. Conscious sedation is a type of sedation. Under conscious sedation you are less alert than normal but are still able to respond to instructions or stimulation. Conscious sedation is used during short medical and dental procedures. It is milder than deep sedation or general anesthesia and allows you to return to your regular activities sooner.  LET [facility] CARE PROVIDER KNOW ABOUT:   Any allergies you have.  All medicines you are taking, including vitamins, herbs, eye drops, creams, and over-the-counter medicines.  Use of steroids (by mouth or creams).  Previous problems you or members of your family have had with the use of anesthetics.  Any blood disorders you have.  Previous surgeries you have had.  Medical conditions you have.  Possibility of pregnancy, if this applies.  Use of cigarettes, alcohol, or illegal drugs. RISKS AND COMPLICATIONS Generally, this is a safe procedure. However, as with any procedure, problems can occur. Possible problems include:  Oversedation.  Trouble breathing on your own. You may need to have a breathing tube until you are awake and breathing on your own.  Allergic reaction to any of the medicines used for the procedure. BEFORE THE PROCEDURE  You may have blood tests done. These tests can help show how well your kidneys and liver are working.  They can also show how well your blood clots.  A physical exam will be done.  Only take medicines as directed by your health care provider. You may need to stop taking medicines (such as blood thinners, aspirin, or nonsteroidal anti-inflammatory drugs) before the procedure.   Do not eat or drink at least 6 hours before the procedure or as directed by your health care provider.  Arrange for a responsible adult, family member,  or friend to take you home after the procedure. He or she should stay with you for at least 24 hours after the procedure, until the medicine has worn off. PROCEDURE   An intravenous (IV) catheter will be inserted into one of your veins. Medicine will be able to flow directly into your body through this catheter. You may be given medicine through this tube to help prevent pain and help you relax.  The medical or dental procedure will be done. AFTER THE PROCEDURE  You will stay in a recovery area until the medicine has worn off. Your blood pressure and pulse will be checked.   Depending on the procedure you had, you may be allowed to go home when you can tolerate liquids and your pain is under control. Document Released: 06/27/2001 Document Revised: 10/07/2013 Document Reviewed: 06/09/2013 [facility] Patient Information 2015 [place], [place]. This information is not intended to replace advice given to you by your health care provider. Make sure you discuss any questions you have with your health care provider.

## 2017-10-24 ENCOUNTER — Other Ambulatory Visit: Payer: Self-pay | Admitting: Gynecologic Oncology

## 2017-10-24 ENCOUNTER — Other Ambulatory Visit (HOSPITAL_COMMUNITY): Payer: Self-pay | Admitting: Gynecologic Oncology

## 2017-10-24 DIAGNOSIS — C569 Malignant neoplasm of unspecified ovary: Secondary | ICD-10-CM

## 2017-11-06 ENCOUNTER — Ambulatory Visit (HOSPITAL_COMMUNITY): Admission: RE | Admit: 2017-11-06 | Payer: 59 | Source: Ambulatory Visit

## 2017-11-14 ENCOUNTER — Ambulatory Visit
Admission: RE | Admit: 2017-11-14 | Discharge: 2017-11-14 | Disposition: A | Discharge status: Home / Self Care | Payer: Self-pay | Source: Ambulatory Visit | Attending: Gynecologic Oncology | Admitting: Gynecologic Oncology

## 2017-11-14 ENCOUNTER — Encounter (HOSPITAL_COMMUNITY): Payer: Self-pay | Admitting: Radiology

## 2017-11-14 ENCOUNTER — Ambulatory Visit (HOSPITAL_COMMUNITY)
Admission: RE | Admit: 2017-11-14 | Discharge: 2017-11-14 | Disposition: A | Discharge status: Home / Self Care | Payer: 59 | Source: Ambulatory Visit | Attending: Gynecologic Oncology | Admitting: Gynecologic Oncology

## 2017-11-14 ENCOUNTER — Other Ambulatory Visit (HOSPITAL_COMMUNITY): Payer: Self-pay | Admitting: Gynecologic Oncology

## 2017-11-14 DIAGNOSIS — N949 Unspecified condition associated with female genital organs and menstrual cycle: Secondary | ICD-10-CM | POA: Diagnosis not present

## 2017-11-14 DIAGNOSIS — C569 Malignant neoplasm of unspecified ovary: Secondary | ICD-10-CM | POA: Diagnosis present

## 2017-11-14 DIAGNOSIS — M488X4 Other specified spondylopathies, thoracic region: Secondary | ICD-10-CM | POA: Insufficient documentation

## 2017-11-14 MED ORDER — IOPAMIDOL (ISOVUE-300) INJECTION 61%
INTRAVENOUS | Status: AC
  Filled 2017-11-14: qty 100

## 2017-11-14 MED ORDER — IOPAMIDOL (ISOVUE-300) INJECTION 61%
100.0000 mL | Freq: Once | INTRAVENOUS | Status: AC | PRN
  Administered 2017-11-14: 100 mL via INTRAVENOUS

## 2018-04-23 ENCOUNTER — Other Ambulatory Visit (HOSPITAL_COMMUNITY): Payer: Self-pay | Admitting: Interventional Radiology

## 2018-04-23 DIAGNOSIS — C561 Malignant neoplasm of right ovary: Secondary | ICD-10-CM

## 2018-05-12 ENCOUNTER — Other Ambulatory Visit: Payer: Self-pay | Admitting: Radiology

## 2018-05-14 ENCOUNTER — Other Ambulatory Visit (HOSPITAL_COMMUNITY): Payer: Self-pay | Admitting: Interventional Radiology

## 2018-05-14 ENCOUNTER — Ambulatory Visit (HOSPITAL_COMMUNITY)
Admission: RE | Admit: 2018-05-14 | Discharge: 2018-05-14 | Disposition: A | Discharge status: Home / Self Care | Payer: 59 | Source: Ambulatory Visit | Attending: Interventional Radiology | Admitting: Interventional Radiology

## 2018-05-14 ENCOUNTER — Encounter (HOSPITAL_COMMUNITY): Payer: Self-pay

## 2018-05-14 DIAGNOSIS — C561 Malignant neoplasm of right ovary: Secondary | ICD-10-CM

## 2018-05-14 DIAGNOSIS — Z85068 Personal history of other malignant neoplasm of small intestine: Secondary | ICD-10-CM | POA: Insufficient documentation

## 2018-05-14 DIAGNOSIS — Z885 Allergy status to narcotic agent status: Secondary | ICD-10-CM | POA: Diagnosis not present

## 2018-05-14 DIAGNOSIS — R188 Other ascites: Secondary | ICD-10-CM | POA: Insufficient documentation

## 2018-05-14 LAB — CBC WITH DIFFERENTIAL/PLATELET
BASOS ABS: 0 10*3/uL (ref 0.0–0.1)
BASOS PCT: 0 %
Eosinophils Absolute: 0.1 10*3/uL (ref 0.0–0.7)
Eosinophils Relative: 1 %
HEMATOCRIT: 41.7 % (ref 36.0–46.0)
HEMOGLOBIN: 14.4 g/dL (ref 12.0–15.0)
Lymphocytes Relative: 32 %
Lymphs Abs: 1.7 10*3/uL (ref 0.7–4.0)
MCH: 28.5 pg (ref 26.0–34.0)
MCHC: 34.5 g/dL (ref 30.0–36.0)
MCV: 82.4 fL (ref 78.0–100.0)
Monocytes Absolute: 0.3 10*3/uL (ref 0.1–1.0)
Monocytes Relative: 6 %
NEUTROS PCT: 61 %
Neutro Abs: 3.3 10*3/uL (ref 1.7–7.7)
Platelets: 184 10*3/uL (ref 150–400)
RBC: 5.06 MIL/uL (ref 3.87–5.11)
RDW: 12.6 % (ref 11.5–15.5)
WBC: 5.4 10*3/uL (ref 4.0–10.5)

## 2018-05-14 LAB — BASIC METABOLIC PANEL
ANION GAP: 7 (ref 5–15)
BUN: 18 mg/dL (ref 6–20)
CALCIUM: 9.6 mg/dL (ref 8.9–10.3)
CHLORIDE: 107 mmol/L (ref 98–111)
CO2: 27 mmol/L (ref 22–32)
Creatinine, Ser: 0.77 mg/dL (ref 0.44–1.00)
GFR calc non Af Amer: >60 mL/min (ref >60)
Glucose, Bld: 87 mg/dL (ref 70–99)
Potassium: 4 mmol/L (ref 3.5–5.1)
Sodium: 141 mmol/L (ref 135–145)

## 2018-05-14 LAB — PROTIME-INR
INR: 0.91
PROTHROMBIN TIME: 12.1 s (ref 11.4–15.2)

## 2018-05-14 MED ORDER — FENTANYL CITRATE (PF) 100 MCG/2ML IJ SOLN
INTRAMUSCULAR | Status: AC
  Filled 2018-05-14: qty 4

## 2018-05-14 MED ORDER — MIDAZOLAM HCL 2 MG/2ML IJ SOLN
INTRAMUSCULAR | Status: AC
  Filled 2018-05-14: qty 6

## 2018-05-14 MED ORDER — FENTANYL CITRATE (PF) 100 MCG/2ML IJ SOLN
INTRAMUSCULAR | Status: AC | PRN
  Administered 2018-05-14 (×4): 50 ug via INTRAVENOUS

## 2018-05-14 MED ORDER — SODIUM CHLORIDE 0.9 % IV SOLN
INTRAVENOUS | Status: DC
  Administered 2018-05-14 (×2): via INTRAVENOUS

## 2018-05-14 MED ORDER — HYDROCODONE-ACETAMINOPHEN 5-325 MG PO TABS: 1.0000 | ORAL_TABLET | ORAL | Status: DC | PRN

## 2018-05-14 MED ORDER — ONDANSETRON HCL 4 MG/2ML IJ SOLN
4.0000 mg | Freq: Once | INTRAMUSCULAR | Status: AC
  Administered 2018-05-14: 4 mg via INTRAVENOUS
  Filled 2018-05-14: qty 2

## 2018-05-14 MED ORDER — SODIUM CHLORIDE 0.9 % IV SOLN
INTRAVENOUS | Status: AC
  Filled 2018-05-14: qty 250

## 2018-05-14 MED ORDER — LIDOCAINE HCL (PF) 1 % IJ SOLN
INTRAMUSCULAR | Status: AC | PRN
  Administered 2018-05-14 (×2): 10 mL

## 2018-05-14 MED ORDER — MIDAZOLAM HCL 2 MG/2ML IJ SOLN
INTRAMUSCULAR | Status: AC | PRN
  Administered 2018-05-14 (×2): 2 mg via INTRAVENOUS
  Administered 2018-05-14 (×2): 1 mg via INTRAVENOUS

## 2018-05-14 NOTE — H&P (Signed)
Referring Physician(s): [place]  Supervising Physician: Aletta Edouard  Patient Status:  [facility] OP  Chief Complaint:  "I'm having more fluid taken off my belly"  Subjective: Patient familiar to IR service for multiple previous abdominal fluid collection aspirations.  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  She presents again today for repeat image guided aspiration of these symptomatic abdominal fluid collections.  Currently denies fever, headache, chest pain, dyspnea, cough, nausea, vomiting or bleeding.  She does have intermittent abdominal and back discomfort.  Past Medical History:  Diagnosis Date  . Ovarian cancer ([place])    20 years ago  . Small bowel cancer ([place])    15 years ago   Past Surgical History:  Procedure Laterality Date  . ABDOMINAL HYSTERECTOMY    . EXPLORATORY LAPAROTOMY W/ BOWEL RESECTION        Allergies: Taxol [paclitaxel] and Demerol [meperidine]  Medications: Prior to Admission medications   Medication Sig Start Date End Date Taking? Authorizing Provider  b complex vitamins tablet Take 1 tablet by mouth every morning.   Yes [provider]  EVENING PRIMROSE OIL PO Take 1 tablet by mouth every morning.   Yes [provider]  MAGNESIUM PO Take 150 mg by mouth every other day.   Yes [provider]  Multiple Vitamin (MULTIVITAMIN WITH MINERALS) TABS Take 1 tablet by mouth every morning.    Yes [provider]  Omega-3 Fatty Acids (FISH OIL) 1000 MG CAPS Take 1 capsule by mouth every morning.   Yes [provider]  OVER THE COUNTER MEDICATION Take 1 each by mouth every morning. Viactive Chews.   Yes [provider]  VITAMIN A PO Take 4,000 Units by mouth every morning.    [provider]     Vital Signs: BP 130/80 (BP  Location: Right Arm)   Pulse 60   Temp 97.8 F (36.6 C) (Oral)   Resp 18   SpO2 97%   Physical Exam awake, alert.  Chest clear to auscultation bilaterally.  Heart with regular rate and rhythm.  Abdomen soft, positive bowel sounds, some mild right lateral abdominal/flank discomfort to palpation.  No lower extremity edema  Imaging: No results found.  Labs:  CBC: Recent Labs    05/14/18 0925  WBC 5.4  HGB 14.4  HCT 41.7  PLT 184    COAGS: No results for input(s): INR, APTT in the last 8760 hours.  BMP: No results for input(s): NA, K, CL, CO2, GLUCOSE, BUN, CALCIUM, CREATININE, GFRNONAA, GFRAA in the last 8760 hours.  Invalid input(s): CMP  LIVER FUNCTION TESTS: No results for input(s): BILITOT, AST, ALT, ALKPHOS, PROT, ALBUMIN in the last 8760 hours.  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  She presents again today for repeat image guided aspiration of these symptomatic abdominal fluid collections.  Details/risks of procedure, including but not limited to, internal bleeding, infection, injury to adjacent structures discussed with patient and husband with their understanding and consent.   Electronically Signed: D. Rowe Robert, PA-C 05/14/2018, 9:40 AM   I spent a total of 20 minutes at the the patient's bedside AND on the patient's hospital floor or unit, greater than 50% of which was counseling/coordinating care  for image guided aspiration of abdominal fluid collections

## 2018-05-14 NOTE — Discharge Instructions (Signed)
Abdominal Fluid Aspiration, Care After Refer to this sheet in the next few weeks. These instructions provide you with information about caring for yourself after your procedure. Your health care provider may also give you more specific instructions. Your treatment has been planned according to current medical practices, but problems sometimes occur. Call your health care provider if you have any problems or questions after your procedure. What can I expect after the procedure? After your procedure, it is common to have a small amount of clear fluid coming from the puncture site. Follow these instructions at home:  Return to your normal activities as told by your health care provider. Ask your health care provider what activities are safe for you.  Take over-the-counter and prescription medicines only as told by your health care provider.  Do not take baths, swim, or use a hot tub until your health care provider approves.  Follow instructions from your health care provider about: ? How to take care of your puncture site. ? When and how you should change your bandage (dressing). MAY REMOVE DRESSING TODAY AND COVER WITH BANDAID; IF NO DRAINAGE AFTER TODAY MAY REMOVE COVER COMPLETELY. ? When you should remove your dressing.  Check your puncture area every day signs of infection. Watch for: ? Redness, swelling, or pain. ? Fluid, blood, or pus.  Keep all follow-up visits as told by your health care provider. This is important. Contact a health care provider if:  You have redness, swelling, or pain at your puncture site.  You start to have more clear fluid coming from your puncture site.  You have blood or pus coming from your puncture site.  You have chills.  You have a fever. Get help right away if:  You develop chest pain or shortness of breath.  You develop increasing pain, discomfort, or swelling in your abdomen.  You feel dizzy or light-headed or you pass out. This information  is not intended to replace advice given to you by your health care provider. Make sure you discuss any questions you have with your health care provider.   Moderate Conscious Sedation, Adult, Care After These instructions provide you with information about caring for yourself after your procedure. Your health care provider may also give you more specific instructions. Your treatment has been planned according to current medical practices, but problems sometimes occur. Call your health care provider if you have any problems or questions after your procedure. What can I expect after the procedure? After your procedure, it is common:  To feel sleepy for several hours.  To feel clumsy and have poor balance for several hours.  To have poor judgment for several hours.  To vomit if you eat too soon.  Follow these instructions at home: For at least 24 hours after the procedure:   Do not: ? Participate in activities where you could fall or become injured. ? Drive. ? Use heavy machinery. ? Drink alcohol. ? Take sleeping pills or medicines that cause drowsiness. ? Make important decisions or sign legal documents. ? Take care of children on your own.  Rest. Eating and drinking  Follow the diet recommended by your health care provider.  If you vomit: ? Drink water, juice, or soup when you can drink without vomiting. ? Make sure you have little or no nausea before eating solid foods. General instructions  Have a responsible adult stay with you until you are awake and alert.  Take over-the-counter and prescription medicines only as told by your health  care provider.  If you smoke, do not smoke without supervision.  Keep all follow-up visits as told by your health care provider. This is important. Contact a health care provider if:  You keep feeling nauseous or you keep vomiting.  You feel light-headed.  You develop a rash.  You have a fever. Get help right away if:  You have  trouble breathing. This information is not intended to replace advice given to you by your health care provider. Make sure you discuss any questions you have with your health care provider. Document Released: 07/23/2013 Document Revised: 03/06/2016 Document Reviewed: 01/22/2016 Elsevier Interactive Patient Education  2018 [place]

## 2018-05-14 NOTE — Procedures (Signed)
Interventional Radiology Procedure Note  Procedure: CT guided catheter drainage of abdominal fluid collections  Complications: None  Estimated Blood Loss: < 10 mL  Findings: 1) Splenic subcapsular collection drained with 5 Fr Yueh catheter followed by 8.5 Fr pigtail drain.  Thick fluid with debris limited volume able to be drained.  250 mL drained with sample sent for cytologic analysis. 2) Right flank collection drained with 5 Fr Yueh catheter yielding 80 mL of fluid with debris. Collection completely collapsed on completion. Sample sent for cytologic analysis.  Venetia Night. Kathlene Cote, M.D Pager:  [phone]

## 2018-05-14 NOTE — Progress Notes (Signed)
Notified Rowe Robert PA if pts BP readings noting given 100 ml bolus of NS. Order given to given 150 ml bolus.

## 2018-12-12 ENCOUNTER — Other Ambulatory Visit: Payer: Self-pay | Admitting: Gynecologic Oncology

## 2018-12-12 DIAGNOSIS — C569 Malignant neoplasm of unspecified ovary: Secondary | ICD-10-CM

## 2018-12-13 ENCOUNTER — Other Ambulatory Visit: Payer: Self-pay | Admitting: Gynecologic Oncology

## 2018-12-13 DIAGNOSIS — C569 Malignant neoplasm of unspecified ovary: Secondary | ICD-10-CM

## 2018-12-18 ENCOUNTER — Ambulatory Visit (HOSPITAL_COMMUNITY)
Admission: RE | Admit: 2018-12-18 | Discharge: 2018-12-18 | Disposition: A | Discharge status: Home / Self Care | Payer: 59 | Source: Ambulatory Visit | Attending: Gynecologic Oncology | Admitting: Gynecologic Oncology

## 2018-12-18 DIAGNOSIS — C569 Malignant neoplasm of unspecified ovary: Secondary | ICD-10-CM

## 2018-12-18 MED ORDER — IOHEXOL 300 MG/ML  SOLN
100.0000 mL | Freq: Once | INTRAMUSCULAR | Status: AC | PRN
  Administered 2018-12-18: 100 mL via INTRAVENOUS

## 2020-01-13 ENCOUNTER — Other Ambulatory Visit (HOSPITAL_COMMUNITY): Payer: Self-pay

## 2020-01-13 ENCOUNTER — Other Ambulatory Visit (HOSPITAL_COMMUNITY): Payer: Self-pay | Admitting: Gynecologic Oncology

## 2020-01-13 DIAGNOSIS — M546 Pain in thoracic spine: Secondary | ICD-10-CM

## 2020-01-13 DIAGNOSIS — R109 Unspecified abdominal pain: Secondary | ICD-10-CM

## 2020-01-13 DIAGNOSIS — C569 Malignant neoplasm of unspecified ovary: Secondary | ICD-10-CM

## 2020-01-15 ENCOUNTER — Other Ambulatory Visit (HOSPITAL_COMMUNITY): Payer: Self-pay

## 2020-01-15 ENCOUNTER — Other Ambulatory Visit (HOSPITAL_COMMUNITY): Payer: Self-pay | Admitting: Interventional Radiology

## 2020-01-15 ENCOUNTER — Other Ambulatory Visit (HOSPITAL_COMMUNITY): Payer: Self-pay | Admitting: Gynecologic Oncology

## 2020-01-15 DIAGNOSIS — M546 Pain in thoracic spine: Secondary | ICD-10-CM

## 2020-01-15 DIAGNOSIS — R109 Unspecified abdominal pain: Secondary | ICD-10-CM

## 2020-01-16 ENCOUNTER — Encounter (HOSPITAL_COMMUNITY): Payer: Self-pay

## 2020-01-16 NOTE — Progress Notes (Signed)
[name] Female, 59 y.o., [birth year] MRN:  [id number] Phone:  [phone] Jerilynn Mages) PCP:  Patient, No Pcp Per Coverage:  [employer] Other Next Appt With Radiology ([facility]-CT 1) 02/06/2020 at 9:00 AM  RE: Biopsy Attn: Dr Kathlene Cote Received: Yesterday Message Contents  Aletta Edouard, MD  Lenore Cordia      See if Fri, April 23 works for her. If not, can work w/ Chartered certified accountant to get me to [employer] another day with a trade.   GY   Previous Messages  ----- Message -----  From: Lenore Cordia  Sent: 01/15/2020  4:00 PM EDT  To: Aletta Edouard, MD  Subject: RE: Biopsy Attn: Dr Kathlene Cote           Your next scheduled time at [facility] according to [facility] is April 19th.  I spoke with patient she will not be available that date.   Okay to go out further or Look at [facility]? Marland Kitchen...  ----- Message -----  From: Aletta Edouard, MD  Sent: 01/15/2020  1:27 PM EDT  To: Lenore Cordia  Subject: RE: Biopsy Attn: Dr Kathlene Cote           OK. Schedule w/ me at [facility] hospital in [place].   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 Core Biopsy specimen will need to be sent to [facility] pathology for [facility] genomic sequencing    FYI: I spoke with Nurse Lelon Frohlich, she is aware that the Dr needs to update her credentials with [place], I could not put the order in under Dr Anell Barr name it states she has no privileges at [facility].

## 2020-02-04 ENCOUNTER — Other Ambulatory Visit: Payer: Self-pay | Admitting: Student

## 2020-02-05 ENCOUNTER — Other Ambulatory Visit: Payer: Self-pay | Admitting: Radiology

## 2020-02-05 ENCOUNTER — Other Ambulatory Visit: Payer: Self-pay | Admitting: Physician Assistant

## 2020-02-06 ENCOUNTER — Other Ambulatory Visit: Payer: Self-pay

## 2020-02-06 ENCOUNTER — Ambulatory Visit (HOSPITAL_COMMUNITY)
Admission: RE | Admit: 2020-02-06 | Discharge: 2020-02-06 | Disposition: A | Discharge status: Home / Self Care | Payer: 59 | Source: Ambulatory Visit | Attending: Interventional Radiology | Admitting: Interventional Radiology

## 2020-02-06 ENCOUNTER — Other Ambulatory Visit (HOSPITAL_COMMUNITY): Payer: Self-pay | Admitting: Interventional Radiology

## 2020-02-06 ENCOUNTER — Encounter (HOSPITAL_COMMUNITY): Payer: Self-pay

## 2020-02-06 ENCOUNTER — Other Ambulatory Visit (HOSPITAL_COMMUNITY): Payer: Self-pay

## 2020-02-06 DIAGNOSIS — M546 Pain in thoracic spine: Secondary | ICD-10-CM | POA: Insufficient documentation

## 2020-02-06 DIAGNOSIS — R161 Splenomegaly, not elsewhere classified: Secondary | ICD-10-CM | POA: Diagnosis not present

## 2020-02-06 DIAGNOSIS — Z8543 Personal history of malignant neoplasm of ovary: Secondary | ICD-10-CM | POA: Diagnosis not present

## 2020-02-06 DIAGNOSIS — Z79899 Other long term (current) drug therapy: Secondary | ICD-10-CM | POA: Diagnosis not present

## 2020-02-06 DIAGNOSIS — C569 Malignant neoplasm of unspecified ovary: Secondary | ICD-10-CM | POA: Insufficient documentation

## 2020-02-06 DIAGNOSIS — R109 Unspecified abdominal pain: Secondary | ICD-10-CM | POA: Diagnosis not present

## 2020-02-06 DIAGNOSIS — Z85068 Personal history of other malignant neoplasm of small intestine: Secondary | ICD-10-CM | POA: Insufficient documentation

## 2020-02-06 LAB — CBC WITH DIFFERENTIAL/PLATELET
Abs Immature Granulocytes: 0.01 10*3/uL (ref 0.00–0.07)
Basophils Absolute: 0 10*3/uL (ref 0.0–0.1)
Basophils Relative: 1 %
Eosinophils Absolute: 0.1 10*3/uL (ref 0.0–0.5)
Eosinophils Relative: 1 %
HCT: 47.7 % — ABNORMAL HIGH (ref 36.0–46.0)
Hemoglobin: 15.3 g/dL — ABNORMAL HIGH (ref 12.0–15.0)
Immature Granulocytes: 0 %
Lymphocytes Relative: 29 %
Lymphs Abs: 1.5 10*3/uL (ref 0.7–4.0)
MCH: 27.9 pg (ref 26.0–34.0)
MCHC: 32.1 g/dL (ref 30.0–36.0)
MCV: 86.9 fL (ref 80.0–100.0)
Monocytes Absolute: 0.5 10*3/uL (ref 0.1–1.0)
Monocytes Relative: 9 %
Neutro Abs: 3.1 10*3/uL (ref 1.7–7.7)
Neutrophils Relative %: 60 %
Platelets: 188 10*3/uL (ref 150–400)
RBC: 5.49 MIL/uL — ABNORMAL HIGH (ref 3.87–5.11)
RDW: 12.1 % (ref 11.5–15.5)
WBC: 5.1 10*3/uL (ref 4.0–10.5)
nRBC: 0 % (ref 0.0–0.2)

## 2020-02-06 LAB — PROTIME-INR
INR: 0.9 (ref 0.8–1.2)
Prothrombin Time: 12.2 s (ref 11.4–15.2)

## 2020-02-06 LAB — BASIC METABOLIC PANEL WITH GFR
Anion gap: 12 (ref 5–15)
BUN: 21 mg/dL — ABNORMAL HIGH (ref 6–20)
CO2: 23 mmol/L (ref 22–32)
Calcium: 9.4 mg/dL (ref 8.9–10.3)
Chloride: 106 mmol/L (ref 98–111)
Creatinine, Ser: 0.71 mg/dL (ref 0.44–1.00)
GFR calc Af Amer: >60 mL/min
GFR calc non Af Amer: >60 mL/min
Glucose, Bld: 74 mg/dL (ref 70–99)
Potassium: 4.9 mmol/L (ref 3.5–5.1)
Sodium: 141 mmol/L (ref 135–145)

## 2020-02-06 MED ORDER — FENTANYL CITRATE (PF) 100 MCG/2ML IJ SOLN
INTRAMUSCULAR | Status: AC | PRN
  Administered 2020-02-06: 50 ug via INTRAVENOUS
  Administered 2020-02-06: 25 ug via INTRAVENOUS
  Administered 2020-02-06: 50 ug via INTRAVENOUS
  Administered 2020-02-06: 25 ug via INTRAVENOUS

## 2020-02-06 MED ORDER — NALOXONE HCL 0.4 MG/ML IJ SOLN
INTRAMUSCULAR | Status: AC
  Filled 2020-02-06: qty 1

## 2020-02-06 MED ORDER — LIDOCAINE HCL (PF) 1 % IJ SOLN
INTRAMUSCULAR | Status: AC | PRN
  Administered 2020-02-06 (×3): 10 mL via INTRADERMAL

## 2020-02-06 MED ORDER — FLUMAZENIL 0.5 MG/5ML IV SOLN
INTRAVENOUS | Status: AC
  Filled 2020-02-06: qty 5

## 2020-02-06 MED ORDER — SODIUM CHLORIDE 0.9 % IV SOLN: INTRAVENOUS | Status: DC

## 2020-02-06 MED ORDER — FENTANYL CITRATE (PF) 100 MCG/2ML IJ SOLN
INTRAMUSCULAR | Status: AC
  Filled 2020-02-06: qty 4

## 2020-02-06 MED ORDER — MIDAZOLAM HCL 2 MG/2ML IJ SOLN
INTRAMUSCULAR | Status: AC | PRN
  Administered 2020-02-06 (×2): 1 mg via INTRAVENOUS
  Administered 2020-02-06: 0.5 mg via INTRAVENOUS
  Administered 2020-02-06: 1 mg via INTRAVENOUS
  Administered 2020-02-06: 0.5 mg via INTRAVENOUS

## 2020-02-06 MED ORDER — MIDAZOLAM HCL 2 MG/2ML IJ SOLN
INTRAMUSCULAR | Status: AC
  Filled 2020-02-06: qty 6

## 2020-02-06 NOTE — Discharge Instructions (Addendum)
Please call Interventional Radiology clinic [phone] with any questions or concerns.  You may remove your dressing and shower tomorrow.   Biopsy Discharge Instructions  The procedure you just had is called a biopsy.  You may feel some discomfort after the local anesthetic wears off.  Your discomfort should improve over the next several days.  AFTER YOUR BIOPSY  Rest for the remainder of the day.  Avoid heavy lifting (more than 10 lb/4.5 kg).  If you have been given a general anesthetic or other medications to help you relax, you should not operate machinery, drive or make legal decisions for 24 hours after your procedure.  Additionally, someone must be available to drive you home.  Only take over-the-counter or prescription medicines for pain, discomfort, or fever as directed by your caregiver.  This can make bleeding worse.  You may resume your usual diet after the procedure.  Avoid alcoholic beverages for 24 hours after your procedure.  Keep the skin around your biopsy site clean and dry.  You may shower after 24 hours.  Cleanse and dry the biopsy site completely after you shower.  Avoid baths and swimming for 72 hours.  Complications are very uncommon after this procedure.  Go to the nearest Emergency Department or contact your caregiver if you develop any of the following symptoms:  Worsening pain  Bleeding  Swelling at the biopsy site  Light headedness or dizziness  Shortness of Breath  Fever or chills  Redness or increased pain or swelling at the biopsy site        Moderate Conscious Sedation, Adult, Care After These instructions provide you with information about caring for yourself after your procedure. Your health care provider may also give you more specific instructions. Your treatment has been planned according to current medical practices, but problems sometimes occur. Call your health care provider if you have any problems or questions after your  procedure. What can I expect after the procedure? After your procedure, it is common:  To feel sleepy for several hours.  To feel clumsy and have poor balance for several hours.  To have poor judgment for several hours.  To vomit if you eat too soon. Follow these instructions at home: For at least 24 hours after the procedure:   Do not: ? Participate in activities where you could fall or become injured. ? Drive. ? Use heavy machinery. ? Drink alcohol. ? Take sleeping pills or medicines that cause drowsiness. ? Make important decisions or sign legal documents. ? Take care of children on your own.  Rest. Eating and drinking  Follow the diet recommended by your health care provider.  If you vomit: ? Drink water, juice, or soup when you can drink without vomiting. ? Make sure you have little or no nausea before eating solid foods. General instructions  Have a responsible adult stay with you until you are awake and alert.  Take over-the-counter and prescription medicines only as told by your health care provider.  If you smoke, do not smoke without supervision.  Keep all follow-up visits as told by your health care provider. This is important. Contact a health care provider if:  You keep feeling nauseous or you keep vomiting.  You feel light-headed.  You develop a rash.  You have a fever. Get help right away if:  You have trouble breathing. This information is not intended to replace advice given to you by your health care provider. Make sure you discuss any questions you have with  your health care provider. Document Revised: 09/14/2017 Document Reviewed: 01/22/2016 Elsevier Patient Education  2020 [employer].

## 2020-02-06 NOTE — Procedures (Signed)
Interventional Radiology Procedure Note  Procedure: CT guided aspiration/drainage of splenic and right posterior subcostal cystic neoplasms; core biopsy of right subcostal mass  Complications: None  Estimated Blood Loss: < 10 mL  Findings: 1) Aspiration of cystic splenic mass with 6 Fr centesis catheter yielded 350 mL of thick, brown liquid with significant decrease in size of lesion. 2) Aspiration of cystic right posterior subcostal mass with 5 Fr Yueh centesis catheter yielded 145 mL of thin, dark brown liquid with complete collapse of lesion. 3) Core biopsy of residual tissue along inferior margin of collapsed right subcostal mass performed via 17 G needle with 18 G core device. Four core samples placed in formalin.  Venetia Night. Kathlene Cote, M.D Pager:  [phone]

## 2020-02-06 NOTE — H&P (Signed)
Chief Complaint: Stage 3 LMP  Referring Physician(s): Aletta Edouard  Supervising Physician: Aletta Edouard  Patient Status: [facility] - Out-pt  History of Present Illness: [name] is a 59 y.o. female well known to our service for multiple previous abdominal fluid collection aspirations.  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    She presents again today for repeat image guided aspiration of these symptomatic abdominal fluid collections.    There is request to send to [facility] for molecular studies.  She is NPO. No nausea/vomiting. No Fever/chills. ROS negative.  No blood thinners.  Past Medical History:  Diagnosis Date  . Ovarian cancer ([place])    20 years ago  . 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 ABDOMINAL HYSTERECTOMY    . EXPLORATORY LAPAROTOMY W/ BOWEL RESECTION      Allergies: Taxol [paclitaxel] and Demerol [meperidine]  Medications: Prior to Admission medications   Medication Sig Start Date End Date Taking? Authorizing Provider  b complex vitamins tablet Take 1 tablet by mouth every morning.    [provider]  EVENING PRIMROSE OIL PO Take 1 tablet by mouth every morning.    [provider]  MAGNESIUM PO Take 150 mg by mouth every other day.    [provider]  Multiple Vitamin (MULTIVITAMIN WITH MINERALS) TABS Take 1 tablet by mouth every morning.     [provider]  Omega-3 Fatty Acids (FISH OIL) 1000 MG CAPS Take 1 capsule by mouth every morning.    [provider]  OVER THE COUNTER MEDICATION Take 1 each by mouth every morning. Viactive Chews.    [provider]  VITAMIN A PO Take 4,000 Units by mouth every morning.    [provider]     No family history on file.  Social  History   Socioeconomic History  . Marital status: Married    Spouse name: Not on file  . Number of children: Not on file  . Years of education: Not on file  . Highest education level: Not on file  Occupational History  . Not on file  Tobacco Use  . Smoking status: Never Smoker  . Smokeless tobacco: Never Used  Substance and Sexual Activity  . Alcohol use: Yes    Comment: social drinking-1-2xmonth  . Drug use: No  . Sexual activity: Not on file  Other Topics Concern  . Not on file  Social History Narrative  . Not on file   Social Determinants of Health   Financial Resource Strain:   . Difficulty of Paying Living Expenses:   Food Insecurity:   . Worried About Charity fundraiser in the Last Year:   . Arboriculturist in the Last Year:   Transportation Needs:   . Film/video editor (Medical):   Marland Kitchen Lack of Transportation (Non-Medical):   Physical Activity:   . Days of Exercise per Week:   . Minutes of Exercise per Session:   Stress:   . Feeling of Stress :   Social Connections:   . Frequency of Communication with Friends and Family:   . Frequency of Social Gatherings with Friends and Family:   . Attends Religious Services:   . Active Member of Clubs or Organizations:   . Attends Club or  Organization Meetings:   Marland Kitchen Marital Status:      Review of Systems: A 12 point ROS discussed and pertinent positives are indicated in the HPI above.  All other systems are negative.  Review of Systems  Vital Signs: There were no vitals taken for this visit.  Physical Exam Vitals reviewed.  Constitutional:      Appearance: Normal appearance.  HENT:     Head: Normocephalic and atraumatic.  Eyes:     Extraocular Movements: Extraocular movements intact.  Cardiovascular:     Rate and Rhythm: Normal rate and regular rhythm.  Pulmonary:     Effort: Pulmonary effort is normal. No respiratory distress.     Breath sounds: Normal breath sounds.  Abdominal:     General: There is  no distension.     Palpations: Abdomen is soft.     Tenderness: There is no abdominal tenderness.  Musculoskeletal:        General: Normal range of motion.  Skin:    General: Skin is warm and dry.  Neurological:     General: No focal deficit present.     Mental Status: She is alert and oriented to person, place, and time.  Psychiatric:        Mood and Affect: Mood normal.        Behavior: Behavior normal.        Thought Content: Thought content normal.        Judgment: Judgment normal.     Imaging: No results found.  Labs:  CBC: No results for input(s): WBC, HGB, HCT, PLT in the last 8760 hours.  COAGS: No results for input(s): INR, APTT in the last 8760 hours.  BMP: No results for input(s): NA, K, CL, CO2, GLUCOSE, BUN, CALCIUM, CREATININE, GFRNONAA, GFRAA in the last 8760 hours.  Invalid input(s): CMP  LIVER FUNCTION TESTS: No results for input(s): BILITOT, AST, ALT, ALKPHOS, PROT, ALBUMIN in the last 8760 hours.  TUMOR MARKERS: No results for input(s): AFPTM, CEA, CA199, CHROMGRNA in the last 8760 hours.  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   Will proceed with image guided aspiration/biopsy today by Dr. Kathlene Cote for molecular studies.  Risks and benefits of image guided aspiration/biopsy was discussed with the patient and/or patient's family including, but not limited to bleeding, infection, damage to adjacent structures or low yield requiring additional tests.  All of the questions were answered and there is agreement to proceed.  Consent signed and in chart.  Electronically Signed: Murrell Redden, PA-C   02/06/2020, 8:01 AM      I spent a total of    25 Minutes in face to face in clinical consultation, greater than 50% of which was counseling/coordinating care for CT guided cyst aspiration.

## 2020-02-09 ENCOUNTER — Other Ambulatory Visit: Payer: Self-pay

## 2020-02-11 LAB — CYTOLOGY - NON PAP

## 2020-05-20 ENCOUNTER — Other Ambulatory Visit: Payer: Self-pay

## 2020-05-20 ENCOUNTER — Other Ambulatory Visit: Payer: Self-pay | Admitting: Rehabilitative and Restorative Service Providers"

## 2020-05-20 ENCOUNTER — Other Ambulatory Visit (HOSPITAL_COMMUNITY): Payer: Self-pay

## 2020-05-20 DIAGNOSIS — C569 Malignant neoplasm of unspecified ovary: Secondary | ICD-10-CM

## 2020-05-25 ENCOUNTER — Ambulatory Visit (HOSPITAL_COMMUNITY): Payer: 59

## 2020-06-03 ENCOUNTER — Encounter (HOSPITAL_COMMUNITY): Payer: Self-pay

## 2020-06-03 ENCOUNTER — Telehealth: Payer: Self-pay | Admitting: Oncology

## 2020-06-03 NOTE — Telephone Encounter (Signed)
Left a message for [place] at [facility] (Dr. Ainsley Spinner nurse) regarding Foundation one testing for patient.  Requested a return call.

## 2020-06-25 ENCOUNTER — Other Ambulatory Visit (HOSPITAL_COMMUNITY): Payer: Self-pay | Admitting: Rehabilitative and Restorative Service Providers"

## 2020-06-25 ENCOUNTER — Other Ambulatory Visit: Payer: Self-pay | Admitting: Gynecologic Oncology

## 2020-06-25 ENCOUNTER — Other Ambulatory Visit (HOSPITAL_COMMUNITY): Payer: Self-pay | Admitting: Gynecologic Oncology

## 2020-06-25 ENCOUNTER — Other Ambulatory Visit: Payer: Self-pay

## 2020-06-25 DIAGNOSIS — C569 Malignant neoplasm of unspecified ovary: Secondary | ICD-10-CM

## 2020-06-29 ENCOUNTER — Ambulatory Visit (HOSPITAL_COMMUNITY)
Admission: RE | Admit: 2020-06-29 | Discharge: 2020-06-29 | Disposition: A | Discharge status: Home / Self Care | Payer: 59 | Source: Ambulatory Visit | Attending: Rehabilitative and Restorative Service Providers" | Admitting: Rehabilitative and Restorative Service Providers"

## 2020-06-29 ENCOUNTER — Encounter (HOSPITAL_COMMUNITY): Payer: Self-pay

## 2020-06-29 ENCOUNTER — Ambulatory Visit (HOSPITAL_COMMUNITY): Payer: 59

## 2020-06-29 ENCOUNTER — Other Ambulatory Visit: Payer: Self-pay

## 2020-06-29 DIAGNOSIS — C569 Malignant neoplasm of unspecified ovary: Secondary | ICD-10-CM

## 2020-06-29 MED ORDER — IOHEXOL 300 MG/ML  SOLN
100.0000 mL | Freq: Once | INTRAMUSCULAR | Status: AC | PRN
  Administered 2020-06-29: 100 mL via INTRAVENOUS

## 2020-07-05 ENCOUNTER — Inpatient Hospital Stay: Payer: 59 | Attending: Gynecologic Oncology | Admitting: Gynecologic Oncology

## 2020-07-05 ENCOUNTER — Encounter: Payer: Self-pay | Admitting: Gynecologic Oncology

## 2020-07-05 ENCOUNTER — Other Ambulatory Visit: Payer: Self-pay

## 2020-07-05 VITALS — BP 135/81 | HR 65 | Temp 98.7°F | Resp 17 | Ht 64.0 in | Wt 125.4 lb

## 2020-07-05 DIAGNOSIS — C569 Malignant neoplasm of unspecified ovary: Secondary | ICD-10-CM | POA: Diagnosis present

## 2020-07-05 DIAGNOSIS — C7889 Secondary malignant neoplasm of other digestive organs: Secondary | ICD-10-CM | POA: Insufficient documentation

## 2020-07-05 DIAGNOSIS — Z85068 Personal history of other malignant neoplasm of small intestine: Secondary | ICD-10-CM | POA: Diagnosis not present

## 2020-07-05 DIAGNOSIS — Z90710 Acquired absence of both cervix and uterus: Secondary | ICD-10-CM

## 2020-07-05 DIAGNOSIS — C7951 Secondary malignant neoplasm of bone: Secondary | ICD-10-CM | POA: Diagnosis not present

## 2020-07-05 DIAGNOSIS — C7989 Secondary malignant neoplasm of other specified sites: Secondary | ICD-10-CM | POA: Diagnosis not present

## 2020-07-05 NOTE — Progress Notes (Signed)
New Patient Note: Gyn-Onc  CC:  Chief Complaint  Patient presents with  . Ovarian Cancer    Assessment/Plan:  Alicia. [name]  is a 59 y.o.  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 Main sites of disease include:  Cystic masses of spleen and right low thoracic/flank sidewall  Thoracic spine.  [name] disease status is measurable, stable.  [name] goals of treatment are palliative.  We discussed options:  1/ continued expectant management with intermittent aspirations of the splenic and right abdominal wall masses.  Annual follow-up with CA 125 with me and evaluations by Dr Kathlene Cote.   2/ surgical resection of the spleen and right flank masses. This would be for palliative goals (to prevent repeated aspirations). We would consult with surgical oncology to enquire about the resectability of these lesions and anticipated morbidity/risk. Given their low symptom profile at present and [name] contentment with repeated aspirations, she may likely be unwilling to take on radical surgery with high risk for diminution of quality of life.   3/ systemic therapy with targeted agent. We will send [name] existing paraffin embedded specimen for IHC evaluation for ER/PR, PDL1, BRAF to determine if there is an actionable mutation.  I discussed that these medical therapies may have additional toxicities. Given that [name] QOL is so optimal at present, she may not be interested in introducing these.   She has bony thoracic mets which are stable. She may benefit from [place]. I provided [name] with information for this.     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  [name]'s cancer history began in 30 when she was debulked wire and exploratory laparotomy TAH/BSO omentectomy for a stage IIIc low malignant potential tumor of the ovary.  Postoperatively she received adjuvant therapy with  cisplatin and Cytoxan.  In 1996 she developed pelvic and abdominal recurrence and was treated with a surgical cytoreductive surgery including rectosigmoid resection with coloproctostomy.  Final pathology revealed recurrent LMP tumor with a low-grade invasive component.  She received Taxol and cisplatin, later changed to Taxotere after a Taxol reaction developed.  After 3-4 cycles she developed progression in the liver, which was treated with topotecan, for which she had a complete response.  She had not received chemotherapy since 1996.  In 2000, persistent recurrent perisplenic cysts were managed with as needed drainage every 12 to 18 months with Dr. Kathlene Cote at [facility].  She was being treated and followed by Dr. Clarene Essex at [facility] during that time.  The cystic masses were treated with intracystic cisplatin, alcohol injection, and prolonged drainage with persistent recurrence.  CT scan in May 2010 revealed multiple nodes in the pelvis and right groin with cystic change at the periphery of the liver.  There were multiple osseous metastases in the thorax.  A PET/CT revealed the most intense uptake in the right groin and Ca1 25 was in the mid 60s.  She underwent fine-needle aspirate of the splenic and right groin masses at that time after [name] CA-125 values had risen to the 90s.  There were rare atypical papillary epithelial fragments and debris with some psammoma bodies compatible with a low malignant potential tumor.  The staining for estrogen receptor and progesterone receptors were positive for ER staining with 75% of the tumor cells with 2+ positive for PR.  The patient was taken off chronic Premarin and changed to tamoxifen.  In the spring  2011 she noted increasing right flank discomfort and had a palpable mass.  CA-125 value was in the 50s.  She was rescanned and underwent aspiration drainage of the recurrent splenic cyst and aspiration of a cystic abnormality in the right flank with cytology suggesting an LMP  tumor.  Of note the right flank lesion had a cystic component as well as the area with focal calcifications around the right liver and kidney.  She discontinued tamoxifen in 2011 and noted resolution of the right groin cyst as well as gradual increase in the right flank cystic masses and fullness in the left upper quadrant.  She underwent aspiration of the right flank fluid collection and para splenic fluid collection on April 04, 2012 at [facility].  Cytology again revealed rare clusters of atypical serous cells consistent with a low malignant potential tumor.  Ca1 25 was 81.3 and remained in this range through July 2015.  She had stable Ca1 25's and no symptoms off of treatment and desired a conservative approach with intermittent aspirations for palliation.  She had undergone genetic testing for 20 for breast and ovarian cancer genes.  Results revealed the following mutations: Variant of undetermined significance in ATM reclassified as likely benign.  In April 2021 she underwent aspiration of the splenic and right flank cystic masses for symptom relief.  At that time sampling of the cyst walls for histology and FoundationOne CDX testing was performed.  Histopathology from that report revealed metastatic carcinoma with calcifications consistent with a low-grade serous ovarian cancer.  The tissue was sent for FoundationOne mutational studies however insufficient tissue was available for them to perform these assays.  Ca1 25 drawn on April 30, 2020 was stable at 19.  The patient reported to me that she is interested in preserving quality of life and treating [name] cystic collections with palliation including intermittent drainage with Dr. Kathlene Cote.  [name] medical history is most significant for this ovarian malignancy. She has scoliosis and bone density scans have been normal.  [name] surgical history is most significant for 2 debulking surgeries via [name] exploratory laparotomy, the second of which  involved a low anterior resection with coloproctostomy.  [name] gynecologic history is remarkable for metastatic recurrent ovarian low grade serous carcinoma.  [name] family cancer history is unremarkable.  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 She lives with [name] supportive husband. She is an avid runner.   Current Meds:  Outpatient Encounter Medications as of 07/05/2020  Medication Sig  . b complex vitamins tablet Take 1 tablet by mouth every morning.  Marland Kitchen EVENING PRIMROSE OIL PO Take 1 tablet by mouth every morning.  Marland Kitchen MAGNESIUM PO Take 150 mg by mouth every other day.  . Multiple Vitamin (MULTIVITAMIN WITH MINERALS) TABS Take 1 tablet by mouth every morning.   . Omega-3 Fatty Acids (FISH OIL) 1000 MG CAPS Take 1 capsule by mouth every morning.  Marland Kitchen OVER THE COUNTER MEDICATION Take 1 each by mouth every morning. Viactive Chews.  . [DISCONTINUED] VITAMIN A PO Take 4,000 Units by mouth every morning.   No facility-administered encounter medications on file as of 07/05/2020.    Allergy:  Allergies  Allergen Reactions  . Paclitaxel Anaphylaxis and Other (See Comments)  . Acetaminophen-Codeine Itching, Nausea And Vomiting and Nausea Only  . Meperidine Nausea And Vomiting    Gastrointestinal Gastrointestinal     Social Hx:   Social History   Socioeconomic History  . Marital status: Married  Spouse name: Not on file  . Number of children: Not on file  . Years of education: Not on file  . Highest education level: Not on file  Occupational History  . Not on file  Tobacco Use  . Smoking status: Never Smoker  . Smokeless tobacco: Never Used  Vaping Use  . Vaping Use: Never used  Substance and Sexual Activity  . Alcohol use: Yes    Comment: social drinking-1-2xmonth  . Drug use: No  . Sexual activity: Not on file  Other Topics Concern  . Not on file  Social History Narrative  . Not on file   Social Determinants of Health   Financial  Resource Strain:   . Difficulty of Paying Living Expenses: Not on file  Food Insecurity:   . Worried About Charity fundraiser in the Last Year: Not on file  . Ran Out of Food in the Last Year: Not on file  Transportation Needs:   . Lack of Transportation (Medical): Not on file  . Lack of Transportation (Non-Medical): Not on file  Physical Activity:   . Days of Exercise per Week: Not on file  . Minutes of Exercise per Session: Not on file  Stress:   . Feeling of Stress : Not on file  Social Connections:   . Frequency of Communication with Friends and Family: Not on file  . Frequency of Social Gatherings with Friends and Family: Not on file  . Attends Religious Services: Not on file  . Active Member of Clubs or Organizations: Not on file  . Attends Archivist Meetings: Not on file  . Marital Status: Not on file  Intimate Partner Violence:   . Fear of Current or Ex-Partner: Not on file  . Emotionally Abused: Not on file  . Physically Abused: Not on file  . Sexually Abused: Not on file    Past Surgical Hx:  Past Surgical History:  Procedure Laterality Date  . ABDOMINAL HYSTERECTOMY    . EXPLORATORY LAPAROTOMY W/ BOWEL RESECTION      Past Medical Hx:  Past Medical History:  Diagnosis Date  . Ovarian cancer ([place])    20 years ago  . 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 Patient has had a hysterectomy.  Family Hx: History reviewed. No pertinent family history.  Review of Systems:  Constitutional  Feels well,  See HPI  ENT Normal appearing ears and nares bilaterally Skin/Breast  No rash, sores, jaundice, itching, dryness Cardiovascular  No chest pain, shortness of breath, or edema  Pulmonary  No cough or wheeze.  Gastro Intestinal  No nausea, vomitting, or diarrhoea. No bright red blood per rectum, no abdominal pain, change in bowel movement, or constipation.  Genito Urinary  No frequency,  urgency, dysuria,  Musculo Skeletal  No myalgia, arthralgia, joint swelling or pain  Neurologic  No weakness, numbness, change in gait,  Psychology  No depression, anxiety, insomnia.   Vitals:  Blood pressure 135/81, pulse 65, temperature 98.7 F (37.1 C), temperature source Tympanic, resp. rate 17, height $RemoveBe'5\' 4"'iNsAloorq$  (1.626 m), weight 125 lb 6.4 oz (56.9 kg), SpO2 100 %.  Physical Exam: WD in NAD Neck  Supple NROM, without any enlargements.  Lymph Node Survey No cervical supraclavicular or inguinal adenopathy Cardiovascular  Pulse normal rate, regularity and rhythm. S1 and S2 normal.  Lungs  Clear to auscultation bilateraly, without wheezes/crackles/rhonchi. Good air movement.  Skin  No rash/lesions/breakdown  Psychiatry  Alert and oriented to person, place, and time  Abdomen  Normoactive bowel sounds, abdomen soft, non-tender and thin without evidence of hernia. Unable to appreciate splenic mass, though there is a palpable fullness in the LUQ on deep palpation. Back + 15cm fullness and protuberance in the right flank, cystic, well circumscribed, somewhat mobile. Nontender. Approximates right 11th and 12th ribs. Genito Urinary  Vulva/vagina: Normal external female genitalia.  No lesions. No discharge or bleeding. Surgically absent uterus and cervix and ovaries. No discretely palpable pelvic masses Rectal  deferred Extremities  No bilateral cyanosis, clubbing or edema.  60 minutes of total time was spent for this patient encounter, including preparation, face-to-face counseling with the patient and coordination of care, review of imaging (results and images), communication with the referring provider and documentation of the encounter.   Thereasa Solo, MD  07/05/2020, 6:00 PM

## 2020-07-05 NOTE — Patient Instructions (Signed)
Dr Denman George recommends that we ask pathology to test the tumor for several markers such as: BRAF, ER/PR, PDL1. She is offering either 1/ continued expectant management with annual visits and CA 125 checks with Dr Denman George, and as needed drainage of the cysts with Dr Kathlene Cote.  2/ consideration of radical resection of the symptomatic lesions (splenectomy and resection of the right chest and flank mass). If you are interested in this, Dr Denman George will set you up to see the surgical oncologist, Dr Barry Dienes, with whom she would perform this surgery. This consultation would allow for discussion of the risks of the resection. 3/ medical therapy with a targeted agent (eg an antiestrogen agent or MEK inhibitor).  Additionally, for the thoracic spine metastases/bone destruction, you should consider taking treatment with the drug Delton See which is a subcutaneous injection every 4 weeks. It slows the destruction of bone. It is associated with the following rare serious side effects: femur fracture, osteonecrosis of the jaw, increased risk for upper respiratory tract infection. More common side effects include: Peripheral edema (Prolia: 5%; Xgeva: 17%),  Dermatitis (Prolia: ?11%), eczema (Prolia: ?11%), skin rash (?14%), Hypocalcemia (Prolia: 2%, including severe hypocalcemia; Xgeva: 18%, severe hypocalcemia: 3%; postmarketing literature suggests a higher incidence of ~40%;hypophosphatemia (Xgeva: 32%; severe: 15% to 21%); Gastrointestinal: Diarrhea (Xgeva: 20% to 34%), nausea (Xgeva: 31% to 32%); Hematologic & oncologic: Anemia (Xgeva: 22%), thrombocytopenia (Xgeva: 19%); Nervous system: Fatigue (Xgeva: ?45%), headache (Prolia: 4%; Xgeva: 11% to 13%); Neuromuscular & skeletal: Arthralgia (Prolia: 7% to 14%), asthenia (Xgeva: ?45%), back pain (Prolia: 5% to 12%; Xgeva: 21%), limb pain (Prolia: 10% to 12%); Respiratory: Cough (Xgeva: 15%), dyspnea (Xgeva: 21%), upper respiratory tract infection (Prolia: 3% to 5%; Xgeva: 15%).    Please contact Dr Serita Grit office (at [phone]) in (or after) April, 2022 to request an appointment with her for September, 2022. Please request a CA 125 blood draw at that time (the office will schedule a lab appointment for this on the same day or earlier if you would like the results available by the time of your visit).

## 2020-07-06 ENCOUNTER — Other Ambulatory Visit: Payer: Self-pay

## 2020-07-06 ENCOUNTER — Encounter: Payer: Self-pay | Admitting: Oncology

## 2020-07-06 NOTE — Progress Notes (Signed)
Requested ER/PR, PD-L1 and BRAF testing per Dr. Rossi on accession [id number] with [facility] via email.  

## 2020-07-07 LAB — SURGICAL PATHOLOGY

## 2020-07-12 ENCOUNTER — Other Ambulatory Visit: Payer: Self-pay | Admitting: Oncology

## 2020-07-12 NOTE — Progress Notes (Signed)
Gynecologic Oncology Multi-Disciplinary Disposition Conference Note  Date of the Conference: 07/12/2020  Patient Name: [name]  Primary GYN Oncologist: Dr. 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 Disposition is to continued expectant management or resection of the spleen and right flank masses or targeted therapy with anti estrogens or MEK inhibitors if BRAF testing is positive.   This Multidisciplinary conference took place involving physicians from [place], [place], Radiation Oncology, Pathology, Radiology along with the Gynecologic Oncology Nurse Practitioner and RN.  Comprehensive assessment of the patient's malignancy, staging, need for surgery, chemotherapy, radiation therapy, and need for further testing were reviewed. Supportive measures, both inpatient and following discharge were also discussed. The recommended plan of care is documented. Greater than 35 minutes were spent correlating and coordinating this patient's care.

## 2020-07-28 ENCOUNTER — Telehealth: Payer: Self-pay | Admitting: Oncology

## 2020-07-28 NOTE — Telephone Encounter (Signed)
Analuisa called to see if any results were back yet.  Advised her of PD L1 and ER/PR results.  BRAF is still pending.  She asked about treatment options for the ER of 70% and was also wondering how to get Xgeva started.    She wanted to let us know that she saw Dr. Valentino Hue at [facility] on 07/21/20 and is being referred to a neurological interventional radiologist.

## 2020-07-28 NOTE — Telephone Encounter (Signed)
Called Maudie Mercury with [facility].  We are waiting on a block that was sent for PD L1 to come back.  As soon as we receive it, it will be sent for BRAF testing.

## 2020-08-02 NOTE — Telephone Encounter (Signed)
Left a message for [name] that we are waiting for BRAF results and then we will get her in to see Dr. Denman George to discuss treatment options.

## 2020-08-20 ENCOUNTER — Encounter (HOSPITAL_COMMUNITY): Payer: Self-pay

## 2020-08-20 ENCOUNTER — Telehealth: Payer: Self-pay | Admitting: Oncology

## 2020-08-20 NOTE — Telephone Encounter (Signed)
Called [name] and advised her that the BRAF testing was not able to be done because there was not enough cells.  Discussed that she can see Dr. Denman George if she has any questions or follow up with Dr. Valentino Hue at [facility].  She said she will call back if she would like to make an appointment.

## 2020-09-20 ENCOUNTER — Telehealth: Payer: Self-pay | Admitting: Oncology

## 2020-09-20 NOTE — Telephone Encounter (Addendum)
Kennidi called and asked if she can be scheduled with Dr. Kathlene Cote for aspiration and biopsy of the right subcostal cystic lesion.  She said she started letrozole right before Thanksgiving.  Advised her that since she is not being seen by Dr. Denman George, that the order would need to come from Dr. Valentino Hue.  Also gave her the number for interventional radiology. She verbalized understanding.

## 2021-02-01 ENCOUNTER — Other Ambulatory Visit: Payer: Self-pay | Admitting: Interventional Radiology

## 2021-02-01 DIAGNOSIS — R109 Unspecified abdominal pain: Secondary | ICD-10-CM

## 2021-02-02 ENCOUNTER — Encounter (HOSPITAL_COMMUNITY): Payer: Self-pay

## 2021-02-02 NOTE — Progress Notes (Unsigned)
[place] Female, 59 y.o., [birth year]  MRN:  [id number] Phone:  [phone] Jerilynn Mages)       PCP:  Patient, No Pcp Per Coverage:  [employer] Other  Next Appt With Radiology ([facility] 1) 02/18/2021 at 9:00 AM           RE: CT Aspiration Received: Today  Message Details  Aletta Edouard, MD  Lenore Cordia I'm also at [facility] on Fri, 5/6. Procedure has to be done at [facility] because that's where patient is used to going. She is coming from [place] area so need to call her to find out what day(s) work for her. 5/6 would be ideal in the first CT slot.   She is for CT guided aspiration of splenic and flank cysts.   GY    Previous Messages  ----- Message -----  From: Lenore Cordia  Sent: 02/02/2021  7:58 AM EDT  To: Aletta Edouard, MD  Subject: CT Aspiration                   I have received an order on patient [place] from [facility] imaging, on the order it says Dr. Kathlene Cote to do and you are the ordering doctor too.    The CP Schedulers forwarded me your available dates:      Listed below are potential dates, as we will need to contact other sites to make sure we can move Dr. Kathlene Cote.   [place], May 9 @ [facility]  Wed, May 11 @ [facility]  Fri, May 13 @ [facility]  [place], May 26 @ [facility] or [facility]  Fri, May 27 @ [facility]   Let us know if any of these dates work.  Thanks,

## 2021-02-16 ENCOUNTER — Other Ambulatory Visit: Payer: Self-pay | Admitting: Student

## 2021-02-17 ENCOUNTER — Other Ambulatory Visit: Payer: Self-pay | Admitting: Radiology

## 2021-02-17 NOTE — H&P (Signed)
Chief Complaint: Patient was seen in consultation today for ovarian cancer  Supervising Physician: Aletta Edouard  Patient Status: [facility] - Out-pt  History of Present Illness: [name] is a 60 y.o. female with past medical history of low-grade serous malignancy of the ovary with known metastatic disease to multiple locations.  She is well known to our service status post prior aspiration procedures to decompress large splenic cystic lesion as well as right posterior subcostal flank. She returns today for symptomatic splenic cyst and requests aspiration.    [name] is assessed in short stay. She is doing well per her report.  Her cystic lesions have reaccumulated and are causing discomfort. She desires aspiration.  She has been NPO.  Does not take blood thinners.  Resumed letrozole in October.   Past Medical History:  Diagnosis Date  . Ovarian cancer ([place])    20 years ago  . 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 ABDOMINAL HYSTERECTOMY    . EXPLORATORY LAPAROTOMY W/ BOWEL RESECTION      Allergies: Paclitaxel, Acetaminophen-codeine, and Meperidine  Medications: Prior to Admission medications   Medication Sig Start Date End Date Taking? Authorizing Provider  b complex vitamins tablet Take 1 tablet by mouth every morning.    [provider]  EVENING PRIMROSE OIL PO Take 1 tablet by mouth every morning.    [provider]  MAGNESIUM PO Take 150 mg by mouth every other day.    [provider]  Multiple Vitamin (MULTIVITAMIN WITH MINERALS) TABS Take 1 tablet by mouth every morning.     [provider]  Omega-3 Fatty Acids (FISH OIL) 1000 MG CAPS Take 1 capsule by mouth every morning.    [provider]  OVER THE COUNTER MEDICATION Take 1 each by mouth every morning. Viactive Chews.    [provider]     History reviewed. No pertinent family  history.  Social History   Socioeconomic History  . Marital status: Married    Spouse name: Not on file  . Number of children: Not on file  . Years of education: Not on file  . Highest education level: Not on file  Occupational History  . Not on file  Tobacco Use  . Smoking status: Never Smoker  . Smokeless tobacco: Never Used  Vaping Use  . Vaping Use: Never used  Substance and Sexual Activity  . Alcohol use: Yes    Comment: social drinking-1-2xmonth  . Drug use: No  . Sexual activity: Not on file  Other Topics Concern  . Not on file  Social History Narrative  . Not on file   Social Determinants of Health   Financial Resource Strain: Not on file  Food Insecurity: Not on file  Transportation Needs: Not on file  Physical Activity: Not on file  Stress: Not on file  Social Connections: Not on file     Review of Systems: A 12 point ROS discussed and pertinent positives are indicated in the HPI above.  All other systems are negative.  Review of Systems  Constitutional: Negative for fatigue and fever.  Respiratory: Negative for cough and shortness of breath.   Cardiovascular: Negative for chest pain.  Gastrointestinal: Positive for abdominal distention and abdominal pain. Negative for nausea and vomiting.  Genitourinary: Positive for flank pain.  Musculoskeletal: Negative for back pain.  Psychiatric/Behavioral: Negative for behavioral problems and confusion.  Vital Signs: BP 123/87   Pulse 65   Temp 97.7 F (36.5 C) (Oral)   Resp 17   SpO2 100%   Physical Exam Vitals and nursing note reviewed.  Constitutional:      General: She is not in acute distress.    Appearance: Normal appearance. She is not ill-appearing.  HENT:     Mouth/Throat:     Mouth: Mucous membranes are moist.     Pharynx: Oropharynx is clear.  Cardiovascular:     Rate and Rhythm: Normal rate and regular rhythm.  Pulmonary:     Effort: Pulmonary effort is normal.     Breath sounds:  Normal breath sounds.  Abdominal:     General: There is distension.     Palpations: There is mass (palpable cyst in RUQ).  Skin:    General: Skin is warm and dry.  Neurological:     General: No focal deficit present.     Mental Status: She is alert and oriented to person, place, and time. Mental status is at baseline.  Psychiatric:        Mood and Affect: Mood normal.        Behavior: Behavior normal.        Thought Content: Thought content normal.        Judgment: Judgment normal.      MD Evaluation Airway: WNL Heart: WNL Abdomen: WNL Chest/ Lungs: WNL ASA  Classification: 3 Mallampati/Airway Score: Two   Imaging: No results found.  Labs:  CBC: Recent Labs    02/18/21 0752  WBC 5.8  HGB 14.4  HCT 42.8  PLT 199    COAGS: Recent Labs    02/18/21 0752  INR 0.9    BMP: No results for input(s): NA, K, CL, CO2, GLUCOSE, BUN, CALCIUM, CREATININE, GFRNONAA, GFRAA in the last 8760 hours.  Invalid input(s): CMP  LIVER FUNCTION TESTS: No results for input(s): BILITOT, AST, ALT, ALKPHOS, PROT, ALBUMIN in the last 8760 hours.  TUMOR MARKERS: No results for input(s): AFPTM, CEA, CA199, CHROMGRNA in the last 8760 hours.  Assessment and Plan: Patient with past medical history of low-grade serous ovarian cancer presents with complaint of recurrent cystic lesions.  IR consulted for aspiration. Case reviewed by Dr. Kathlene Cote who approves patient for procedure.  Patient presents today in their usual state of health.  She has been NPO and is not currently on blood thinners.   Risks and benefits of aspiration was discussed with the patient and/or patient's family including, but not limited to bleeding, infection, damage to adjacent structures or low yield requiring additional tests.  All of the questions were answered and there is agreement to proceed.  Consent signed and in chart.  Thank you for this interesting consult.  I greatly enjoyed meeting [name]  and look forward to participating in their care.  A copy of this report was sent to the requesting provider on this date.  Electronically Signed: Docia Barrier, PA 02/18/2021, 8:55 AM   I spent a total of  30 Minutes   in face to face in clinical consultation, greater than 50% of which was counseling/coordinating care for serous ovarian cancer.

## 2021-02-18 ENCOUNTER — Ambulatory Visit (HOSPITAL_COMMUNITY)
Admission: RE | Admit: 2021-02-18 | Discharge: 2021-02-18 | Disposition: A | Discharge status: Home / Self Care | Payer: 59 | Source: Ambulatory Visit | Attending: Interventional Radiology | Admitting: Interventional Radiology

## 2021-02-18 ENCOUNTER — Other Ambulatory Visit: Payer: Self-pay

## 2021-02-18 ENCOUNTER — Encounter (HOSPITAL_COMMUNITY): Payer: Self-pay

## 2021-02-18 ENCOUNTER — Other Ambulatory Visit: Payer: Self-pay | Admitting: Interventional Radiology

## 2021-02-18 DIAGNOSIS — R109 Unspecified abdominal pain: Secondary | ICD-10-CM | POA: Diagnosis present

## 2021-02-18 LAB — CBC
HCT: 42.8 % (ref 36.0–46.0)
Hemoglobin: 14.4 g/dL (ref 12.0–15.0)
MCH: 28 pg (ref 26.0–34.0)
MCHC: 33.6 g/dL (ref 30.0–36.0)
MCV: 83.3 fL (ref 80.0–100.0)
Platelets: 199 10*3/uL (ref 150–400)
RBC: 5.14 MIL/uL — ABNORMAL HIGH (ref 3.87–5.11)
RDW: 12.4 % (ref 11.5–15.5)
WBC: 5.8 10*3/uL (ref 4.0–10.5)
nRBC: 0 % (ref 0.0–0.2)

## 2021-02-18 LAB — PROTIME-INR
INR: 0.9 (ref 0.8–1.2)
Prothrombin Time: 12 seconds (ref 11.4–15.2)

## 2021-02-18 MED ORDER — MIDAZOLAM HCL 2 MG/2ML IJ SOLN
INTRAMUSCULAR | Status: AC
  Filled 2021-02-18: qty 6

## 2021-02-18 MED ORDER — ONDANSETRON HCL 4 MG/2ML IJ SOLN
INTRAMUSCULAR | Status: AC | PRN
  Administered 2021-02-18: 4 mg via INTRAVENOUS

## 2021-02-18 MED ORDER — ONDANSETRON HCL 4 MG/2ML IJ SOLN
INTRAMUSCULAR | Status: AC
  Filled 2021-02-18: qty 2

## 2021-02-18 MED ORDER — FLUMAZENIL 0.5 MG/5ML IV SOLN
INTRAVENOUS | Status: AC
  Filled 2021-02-18: qty 5

## 2021-02-18 MED ORDER — FENTANYL CITRATE (PF) 100 MCG/2ML IJ SOLN
INTRAMUSCULAR | Status: AC
  Filled 2021-02-18: qty 4

## 2021-02-18 MED ORDER — FENTANYL CITRATE (PF) 100 MCG/2ML IJ SOLN
INTRAMUSCULAR | Status: AC | PRN
  Administered 2021-02-18 (×3): 50 ug via INTRAVENOUS

## 2021-02-18 MED ORDER — MIDAZOLAM HCL 2 MG/2ML IJ SOLN
INTRAMUSCULAR | Status: AC | PRN
  Administered 2021-02-18: 0.5 mg via INTRAVENOUS
  Administered 2021-02-18: 2 mg via INTRAVENOUS
  Administered 2021-02-18: 0.5 mg via INTRAVENOUS

## 2021-02-18 MED ORDER — NALOXONE HCL 0.4 MG/ML IJ SOLN
INTRAMUSCULAR | Status: AC
  Filled 2021-02-18: qty 1

## 2021-02-18 MED ORDER — LIDOCAINE HCL (PF) 1 % IJ SOLN
INTRAMUSCULAR | Status: AC | PRN
  Administered 2021-02-18 (×2): 10 mL via INTRADERMAL

## 2021-02-18 NOTE — Sedation Documentation (Signed)
Right side cyst 300cc bloody fluid aspirated.

## 2021-02-18 NOTE — Sedation Documentation (Signed)
450cc caramel colored fluid aspirated from spleen.

## 2021-02-18 NOTE — Discharge Instructions (Signed)
Please call Interventional Radiology clinic [phone] with any questions or concerns.  You may remove your dressing and shower tomorrow.   Needle Biopsy, Care After These instructions tell you how to care for yourself after your procedure. Your doctor may also give you more specific instructions. Call your doctor if you have any problems or questions. What can I expect after the procedure? After the procedure, it is common to have:  Soreness.  Bruising.  Mild pain. Follow these instructions at home:  Return to your normal activities as told by your doctor. Ask your doctor what activities are safe for you.  Take over-the-counter and prescription medicines only as told by your doctor.  Wash your hands with soap and water before you change your bandage (dressing). If you cannot use soap and water, use hand sanitizer.  Follow instructions from your doctor about: ? How to take care of your puncture site. ? When and how to change your bandage. ? When to remove your bandage.  Check your puncture site every day for signs of infection. Watch for: ? Redness, swelling, or pain. ? Fluid or blood. ? Pus or a bad smell. ? Warmth.  Do not take baths, swim, or use a hot tub until your doctor approves. Ask your doctor if you may take showers. You may only be allowed to take sponge baths.  Keep all follow-up visits as told by your doctor. This is important.   Contact a doctor if you have:  A fever.  Redness, swelling, or pain at the puncture site, and it lasts longer than a few days.  Fluid, blood, or pus coming from the puncture site.  Warmth coming from the puncture site. Get help right away if:  You have a lot of bleeding from the puncture site. Summary  After the procedure, it is common to have soreness, bruising, or mild pain at the puncture site.  Check your puncture site every day for signs of infection, such as redness, swelling, or pain.  Get help right away if you  have severe bleeding from your puncture site. This information is not intended to replace advice given to you by your health care provider. Make sure you discuss any questions you have with your health care provider. Document Revised: 04/01/2020 Document Reviewed: 04/01/2020 Elsevier Patient Education  2021 Elsevier Inc.   Moderate Conscious Sedation, Adult, Care After This sheet gives you information about how to care for yourself after your procedure. Your health care provider may also give you more specific instructions. If you have problems or questions, contact your health care provider. What can I expect after the procedure? After the procedure, it is common to have:  Sleepiness for several hours.  Impaired judgment for several hours.  Difficulty with balance.  Vomiting if you eat too soon. Follow these instructions at home: For the time period you were told by your health care provider:  Rest.  Do not participate in activities where you could fall or become injured.  Do not drive or use machinery.  Do not drink alcohol.  Do not take sleeping pills or medicines that cause drowsiness.  Do not make important decisions or sign legal documents.  Do not take care of children on your own.      Eating and drinking  Follow the diet recommended by your health care provider.  Drink enough fluid to keep your urine pale yellow.  If you vomit: ? Drink water, juice, or soup when you can drink without vomiting. ?   Make sure you have little or no nausea before eating solid foods.   General instructions  Take over-the-counter and prescription medicines only as told by your health care provider.  Have a responsible adult stay with you for the time you are told. It is important to have someone help care for you until you are awake and alert.  Do not smoke.  Keep all follow-up visits as told by your health care provider. This is important. Contact a health care provider  if:  You are still sleepy or having trouble with balance after 24 hours.  You feel light-headed.  You keep feeling nauseous or you keep vomiting.  You develop a rash.  You have a fever.  You have redness or swelling around the IV site. Get help right away if:  You have trouble breathing.  You have new-onset confusion at home. Summary  After the procedure, it is common to feel sleepy, have impaired judgment, or feel nauseous if you eat too soon.  Rest after you get home. Know the things you should not do after the procedure.  Follow the diet recommended by your health care provider and drink enough fluid to keep your urine pale yellow.  Get help right away if you have trouble breathing or new-onset confusion at home. This information is not intended to replace advice given to you by your health care provider. Make sure you discuss any questions you have with your health care provider. Document Revised: 01/30/2020 Document Reviewed: 08/28/2019 Elsevier Patient Education  2021 Elsevier Inc.   

## 2021-02-18 NOTE — Procedures (Signed)
Interventional Radiology Procedure Note  Procedure: CT guided aspiration of splenic and right posterior subcostal/flank cystic lesions  Complications: None  Estimated Blood Loss: < 10 mL  Findings: Recurrent cystic lesions aspirated via 5 Fr Yueh catheters.  Right posterior subcostal/flank lesion yielded 300 mL of dark fluid with complete collapse of lesion.  Splenic lesion yielded 425 mL of tan colored fluid with significant decompression.  Venetia Night. Kathlene Cote, M.D Pager:  [phone]

## 2021-05-03 ENCOUNTER — Other Ambulatory Visit (HOSPITAL_COMMUNITY): Payer: Self-pay | Admitting: Oncology

## 2021-05-03 DIAGNOSIS — Z8543 Personal history of malignant neoplasm of ovary: Secondary | ICD-10-CM

## 2021-05-10 ENCOUNTER — Ambulatory Visit (HOSPITAL_COMMUNITY): Payer: 59

## 2023-04-05 ENCOUNTER — Other Ambulatory Visit (HOSPITAL_COMMUNITY): Payer: Self-pay | Admitting: Interventional Radiology

## 2023-04-05 DIAGNOSIS — D734 Cyst of spleen: Secondary | ICD-10-CM

## 2023-04-23 ENCOUNTER — Other Ambulatory Visit: Payer: Self-pay | Admitting: Radiology

## 2023-04-23 DIAGNOSIS — C569 Malignant neoplasm of unspecified ovary: Secondary | ICD-10-CM

## 2023-04-24 ENCOUNTER — Other Ambulatory Visit (HOSPITAL_COMMUNITY): Payer: Self-pay | Admitting: Interventional Radiology

## 2023-04-24 ENCOUNTER — Ambulatory Visit (HOSPITAL_COMMUNITY)
Admission: RE | Admit: 2023-04-24 | Discharge: 2023-04-24 | Disposition: A | Discharge status: Home / Self Care | Payer: BC Managed Care – PPO | Source: Ambulatory Visit | Attending: Interventional Radiology | Admitting: Interventional Radiology

## 2023-04-24 ENCOUNTER — Ambulatory Visit (HOSPITAL_COMMUNITY)
Admission: RE | Admit: 2023-04-24 | Discharge: 2023-04-24 | Disposition: A | Discharge status: Home / Self Care | Payer: BC Managed Care – PPO | Source: Ambulatory Visit | Attending: Interventional Radiology

## 2023-04-24 ENCOUNTER — Encounter (HOSPITAL_COMMUNITY): Payer: Self-pay

## 2023-04-24 DIAGNOSIS — D734 Cyst of spleen: Secondary | ICD-10-CM

## 2023-04-24 MED ORDER — MIDAZOLAM HCL 2 MG/2ML IJ SOLN
INTRAMUSCULAR | Status: AC | PRN
  Administered 2023-04-24: 1 mg via INTRAVENOUS
  Administered 2023-04-24: 2 mg via INTRAVENOUS
  Administered 2023-04-24: 1 mg via INTRAVENOUS

## 2023-04-24 MED ORDER — MIDAZOLAM HCL 2 MG/2ML IJ SOLN
INTRAMUSCULAR | Status: AC
  Filled 2023-04-24: qty 6

## 2023-04-24 MED ORDER — SODIUM CHLORIDE 0.9 % IV SOLN: INTRAVENOUS | Status: DC

## 2023-04-24 MED ORDER — FENTANYL CITRATE (PF) 100 MCG/2ML IJ SOLN
INTRAMUSCULAR | Status: AC | PRN
  Administered 2023-04-24 (×4): 50 ug via INTRAVENOUS

## 2023-04-24 MED ORDER — LIDOCAINE HCL (PF) 1 % IJ SOLN
INTRAMUSCULAR | Status: AC | PRN
  Administered 2023-04-24: 30 mL

## 2023-04-24 MED ORDER — ONDANSETRON HCL 4 MG/2ML IJ SOLN
INTRAMUSCULAR | Status: AC
  Filled 2023-04-24: qty 2

## 2023-04-24 MED ORDER — FENTANYL CITRATE (PF) 100 MCG/2ML IJ SOLN
INTRAMUSCULAR | Status: AC
  Filled 2023-04-24: qty 4

## 2023-04-24 NOTE — Sedation Documentation (Addendum)
Right subcostal 100cc aspirated. Left splenic cyst 450cc aspirated

## 2023-04-24 NOTE — Procedures (Signed)
Interventional Radiology Procedure Note  Procedure: CT guided aspiration of splenic and right flank cystic masses  Complications: None  Estimated Blood Loss: < 10 mL  Findings: Right flank cystic mass yielded 100 mL of thin, serosanguinous fluid with complete decompression of collection.  Left splenic cystic mass yielded 450 mL of thick, brown fluid with significant decompression of collection.  Jodi Marble. Fredia Sorrow, M.D Pager:  [phone]

## 2023-04-24 NOTE — H&P (Signed)
Chief Complaint: Patient was seen in consultation today for splenic cyst and right subcostal fluid collection.  Referring Physician(s): Irish Lack  Supervising Physician: Irish Lack  Patient Status: [facility] - Out-pt  History of Present Illness: [name] is a 62 y.o. 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 She is well known to our service status post prior aspiration procedures to decompress large splenic cystic lesion as well as right posterior subcostal flank, most recently 02/18/21. She returns today for symptomatic splenic cyst and right subcostal fluid aspiration.  Mr. Hiniker seen in short stay with husband at bedside, she denies any complaints and has been doing well overall. She currently sees oncology at [facility] (Dr. Allyson Sabal) which she enjoys. She has had some pressure on the left side which is typically what occurs when she requires drainage, otherwise no significant pain or dyspnea. She is ready to proceed as planned.    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 Authorizing Provider  Ascorbic Acid (VITAMIN C) 1000 MG tablet Take 1,000 mg by mouth daily.   Yes [provider]  b complex vitamins tablet Take 1 tablet by mouth every morning.   Yes [provider]  EVENING PRIMROSE OIL PO Take 1 tablet by mouth every morning.   Yes [provider]  MAGNESIUM PO Take 150 mg by mouth every other day.   Yes [provider]  Multiple Vitamin (MULTIVITAMIN WITH MINERALS)  TABS Take 1 tablet by mouth every morning.    Yes [provider]  Omega-3 Fatty Acids (FISH OIL) 1000 MG CAPS Take 1 capsule by mouth every morning.   Yes [provider]  OVER THE COUNTER MEDICATION Take 1 each by mouth every morning. Viactive Chews.   Yes [provider]     History reviewed. No pertinent family history.  Social History   Socioeconomic History   Marital status: Married    Spouse name: Not on file   Number of children: Not on file   Years of education: Not on file   Highest education level: Not on file  Occupational History   Not on file  Tobacco Use   Smoking status: Never   Smokeless tobacco: Never  Vaping Use   Vaping Use: Never used  Substance and Sexual Activity   Alcohol use: Yes    Comment: social drinking-1-2xmonth   Drug use: No   Sexual activity: Not on file  Other Topics Concern   Not on file  Social History Narrative   Not on file   Social Determinants of Health   Financial Resource Strain: Not on file  Food Insecurity: Not on file  Transportation Needs: Not on file  Physical Activity: Not on file  Stress: Not on file  Social Connections: Not on file     Review of Systems: A 12 point ROS discussed and pertinent positives are indicated in the HPI above.  All other systems are negative.  Review of Systems  Constitutional:  Negative for chills and  fever.  Respiratory:  Negative for cough and shortness of breath.   Cardiovascular:  Negative for chest pain.  Gastrointestinal:  Negative for abdominal pain, diarrhea, nausea and vomiting.  Musculoskeletal:  Negative for back pain.  Neurological:  Negative for dizziness and headaches.    Vital Signs: BP 128/89   Pulse 78   Temp 98 F (36.7 C) (Oral)   Resp 18   Ht 5' 3.75" (1.619 m)   Wt 122 lb (55.3 kg)   SpO2 97%   BMI 21.11 kg/m   Physical Exam Vitals reviewed.  Constitutional:      General: She is not in acute distress. HENT:     Head:  Normocephalic.     Mouth/Throat:     Mouth: Mucous membranes are moist.     Pharynx: Oropharynx is clear. No oropharyngeal exudate or posterior oropharyngeal erythema.  Cardiovascular:     Rate and Rhythm: Normal rate and regular rhythm.  Pulmonary:     Effort: Pulmonary effort is normal.     Breath sounds: Normal breath sounds.  Abdominal:     Palpations: Abdomen is soft.  Skin:    General: Skin is warm and dry.  Neurological:     Mental Status: She is alert and oriented to person, place, and time.  Psychiatric:        Mood and Affect: Mood normal.        Behavior: Behavior normal.        Thought Content: Thought content normal.        Judgment: Judgment normal.      MD Evaluation Airway: WNL Heart: WNL Abdomen: WNL Chest/ Lungs: WNL ASA  Classification: 2 Mallampati/Airway Score: One   Imaging: No results found.  Labs:  CBC: No results for input(s): "WBC", "HGB", "HCT", "PLT" in the last 8760 hours.  COAGS: No results for input(s): "INR", "APTT" in the last 8760 hours.  BMP: No results for input(s): "NA", "K", "CL", "CO2", "GLUCOSE", "BUN", "CALCIUM", "CREATININE", "GFRNONAA", "GFRAA" in the last 8760 hours.  Invalid input(s): "CMP"  LIVER FUNCTION TESTS: No results for input(s): "BILITOT", "AST", "ALT", "ALKPHOS", "PROT", "ALBUMIN" in the last 8760 hours.  TUMOR MARKERS: No results for input(s): "AFPTM", "CEA", "CA199", "CHROMGRNA" in the last 8760 hours.  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 She presents today for repeat aspiration.  Risks and benefits of splenic cyst and right subcostal fluid collection was discussed with the patient and/or patient's family including, but not limited to bleeding, infection and damage to adjacent structures.   All of the questions were  answered and there is agreement to proceed.  Consent signed and in chart.  Thank you for this interesting consult.  I greatly enjoyed meeting MARQUEL HAKE and look forward to participating in their care.  A copy of this report was sent to the requesting provider on this date.  Electronically Signed: Villa Herb, PA-C 04/24/2023, 12:07 PM   I spent a total of  25 Minutes in face to face in clinical consultation, greater than 50% of which was counseling/coordinating care for ovarian cancer.

## 2024-05-02 ENCOUNTER — Encounter (HOSPITAL_COMMUNITY): Payer: Self-pay | Admitting: Obstetrics and Gynecology
